# Patient Record
Sex: Male | Born: 1950
Health system: Southern US, Community
[De-identification: ages and names within clinical notes are randomized; demographics above are authoritative.]

## PROBLEM LIST (undated history)

## (undated) DIAGNOSIS — G4730 Sleep apnea, unspecified: Secondary | ICD-10-CM

## (undated) DIAGNOSIS — M1990 Unspecified osteoarthritis, unspecified site: Secondary | ICD-10-CM

## (undated) DIAGNOSIS — R0602 Shortness of breath: Secondary | ICD-10-CM

## (undated) DIAGNOSIS — E785 Hyperlipidemia, unspecified: Secondary | ICD-10-CM

## (undated) DIAGNOSIS — Z9889 Other specified postprocedural states: Secondary | ICD-10-CM

## (undated) DIAGNOSIS — R112 Nausea with vomiting, unspecified: Secondary | ICD-10-CM

## (undated) DIAGNOSIS — E039 Hypothyroidism, unspecified: Secondary | ICD-10-CM

## (undated) DIAGNOSIS — R809 Proteinuria, unspecified: Secondary | ICD-10-CM

## (undated) DIAGNOSIS — I10 Essential (primary) hypertension: Secondary | ICD-10-CM

## (undated) DIAGNOSIS — Z8719 Personal history of other diseases of the digestive system: Secondary | ICD-10-CM

## (undated) HISTORY — PX: TONSILLECTOMY: SUR1361

## (undated) HISTORY — PX: FASCIOTOMY: SHX132

## (undated) HISTORY — PX: APPENDECTOMY: SHX54

## (undated) HISTORY — PX: JOINT REPLACEMENT: SHX530

## (undated) HISTORY — PX: KNEE ARTHROSCOPY: SUR90

## (undated) HISTORY — PX: ARM WOUND REPAIR / CLOSURE: SUR1141

## (undated) HISTORY — PX: CARPAL TUNNEL RELEASE: SHX101

## (undated) HISTORY — PX: HERNIA REPAIR: SHX51

---

## 2000-04-09 ENCOUNTER — Ambulatory Visit (HOSPITAL_BASED_OUTPATIENT_CLINIC_OR_DEPARTMENT_OTHER): Admission: RE | Admit: 2000-04-09 | Discharge: 2000-04-09 | Payer: Self-pay | Admitting: Family Medicine

## 2000-07-22 ENCOUNTER — Encounter: Admission: RE | Admit: 2000-07-22 | Discharge: 2000-07-22 | Payer: Self-pay | Admitting: Neurosurgery

## 2000-07-22 ENCOUNTER — Encounter: Payer: Self-pay | Admitting: Neurosurgery

## 2003-06-03 ENCOUNTER — Encounter: Admission: RE | Admit: 2003-06-03 | Discharge: 2003-06-03 | Payer: Self-pay | Admitting: Orthopaedic Surgery

## 2003-06-03 ENCOUNTER — Encounter: Payer: Self-pay | Admitting: Orthopaedic Surgery

## 2008-09-25 ENCOUNTER — Encounter: Admission: RE | Admit: 2008-09-25 | Discharge: 2008-09-25 | Payer: Self-pay | Admitting: Rheumatology

## 2010-03-25 ENCOUNTER — Inpatient Hospital Stay (HOSPITAL_COMMUNITY): Admission: RE | Admit: 2010-03-25 | Discharge: 2010-03-27 | Payer: Self-pay | Admitting: Orthopedic Surgery

## 2010-06-10 ENCOUNTER — Ambulatory Visit (HOSPITAL_BASED_OUTPATIENT_CLINIC_OR_DEPARTMENT_OTHER): Admission: RE | Admit: 2010-06-10 | Discharge: 2010-06-10 | Payer: Self-pay | Admitting: Orthopedic Surgery

## 2010-12-03 LAB — POCT I-STAT, CHEM 8
BUN: 10 mg/dL (ref 6–23)
Creatinine, Ser: 0.8 mg/dL (ref 0.4–1.5)
Glucose, Bld: 90 mg/dL (ref 70–99)
Hemoglobin: 14.6 g/dL (ref 13.0–17.0)
Potassium: 4.1 mEq/L (ref 3.5–5.1)
TCO2: 25 mmol/L (ref 0–100)

## 2010-12-06 LAB — URINALYSIS, ROUTINE W REFLEX MICROSCOPIC
Bilirubin Urine: NEGATIVE
Glucose, UA: NEGATIVE mg/dL
Hgb urine dipstick: NEGATIVE
Specific Gravity, Urine: 1.007 (ref 1.005–1.030)

## 2010-12-06 LAB — CBC
HCT: 33.9 % — ABNORMAL LOW (ref 39.0–52.0)
HCT: 35.1 % — ABNORMAL LOW (ref 39.0–52.0)
Hemoglobin: 12.1 g/dL — ABNORMAL LOW (ref 13.0–17.0)
MCH: 31.5 pg (ref 26.0–34.0)
MCH: 31.7 pg (ref 26.0–34.0)
MCH: 31.8 pg (ref 26.0–34.0)
MCHC: 34.1 g/dL (ref 30.0–36.0)
MCHC: 34.4 g/dL (ref 30.0–36.0)
MCHC: 34.4 g/dL (ref 30.0–36.0)
MCV: 92.3 fL (ref 78.0–100.0)
MCV: 92.4 fL (ref 78.0–100.0)
Platelets: 200 10*3/uL (ref 150–400)
Platelets: 250 10*3/uL (ref 150–400)
RBC: 4.52 MIL/uL (ref 4.22–5.81)
RDW: 13.5 % (ref 11.5–15.5)
RDW: 13.8 % (ref 11.5–15.5)
WBC: 12.1 10*3/uL — ABNORMAL HIGH (ref 4.0–10.5)

## 2010-12-06 LAB — BASIC METABOLIC PANEL
BUN: 10 mg/dL (ref 6–23)
BUN: 7 mg/dL (ref 6–23)
CO2: 26 mEq/L (ref 19–32)
CO2: 26 mEq/L (ref 19–32)
CO2: 27 mEq/L (ref 19–32)
Calcium: 9.7 mg/dL (ref 8.4–10.5)
Chloride: 101 mEq/L (ref 96–112)
Chloride: 102 mEq/L (ref 96–112)
Creatinine, Ser: 0.78 mg/dL (ref 0.4–1.5)
Creatinine, Ser: 0.91 mg/dL (ref 0.4–1.5)
GFR calc Af Amer: >60 mL/min (ref >60)
GFR calc non Af Amer: >60 mL/min (ref >60)
Glucose, Bld: 123 mg/dL — ABNORMAL HIGH (ref 70–99)
Glucose, Bld: 141 mg/dL — ABNORMAL HIGH (ref 70–99)
Glucose, Bld: 90 mg/dL (ref 70–99)
Potassium: 3.6 mEq/L (ref 3.5–5.1)
Potassium: 4 mEq/L (ref 3.5–5.1)
Sodium: 134 mEq/L — ABNORMAL LOW (ref 135–145)

## 2010-12-06 LAB — DIFFERENTIAL
Basophils Relative: 1 % (ref 0–1)
Eosinophils Absolute: 0.3 10*3/uL (ref 0.0–0.7)
Eosinophils Relative: 4 % (ref 0–5)
Lymphs Abs: 2.3 10*3/uL (ref 0.7–4.0)
Monocytes Relative: 10 % (ref 3–12)

## 2010-12-06 LAB — TYPE AND SCREEN: Antibody Screen: NEGATIVE

## 2010-12-06 LAB — SURGICAL PCR SCREEN
MRSA, PCR: NEGATIVE
Staphylococcus aureus: POSITIVE — AB

## 2010-12-06 LAB — PROTIME-INR
INR: 1.15 (ref 0.00–1.49)
Prothrombin Time: 13.4 seconds (ref 11.6–15.2)

## 2011-08-31 IMAGING — CR DG KNEE 1-2V PORT*R*
2 series · 2 of 2 positions shown · non-contrast
Comparison: None.

CLINICAL DATA: Post knee replacement

PORTABLE RIGHT KNEE - 1-2 VIEW

[view not recorded (1 of 2)]
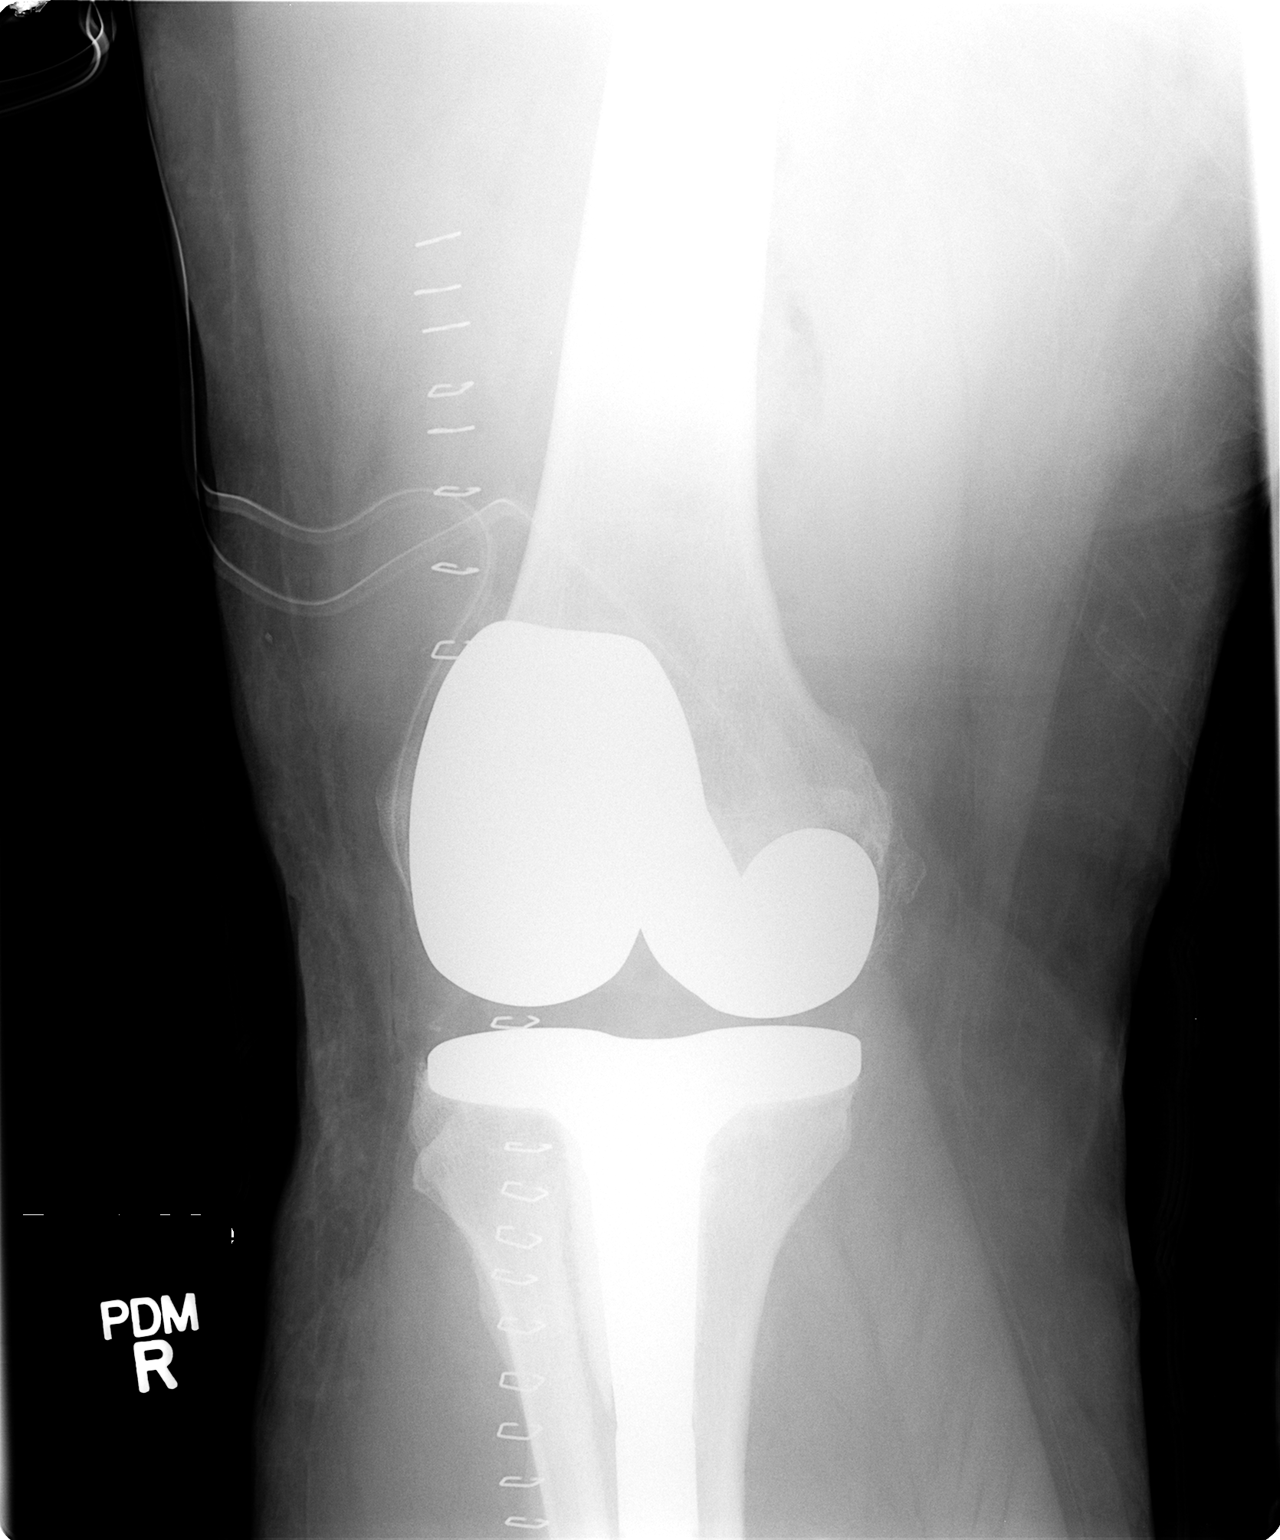

[view not recorded (2 of 2)]
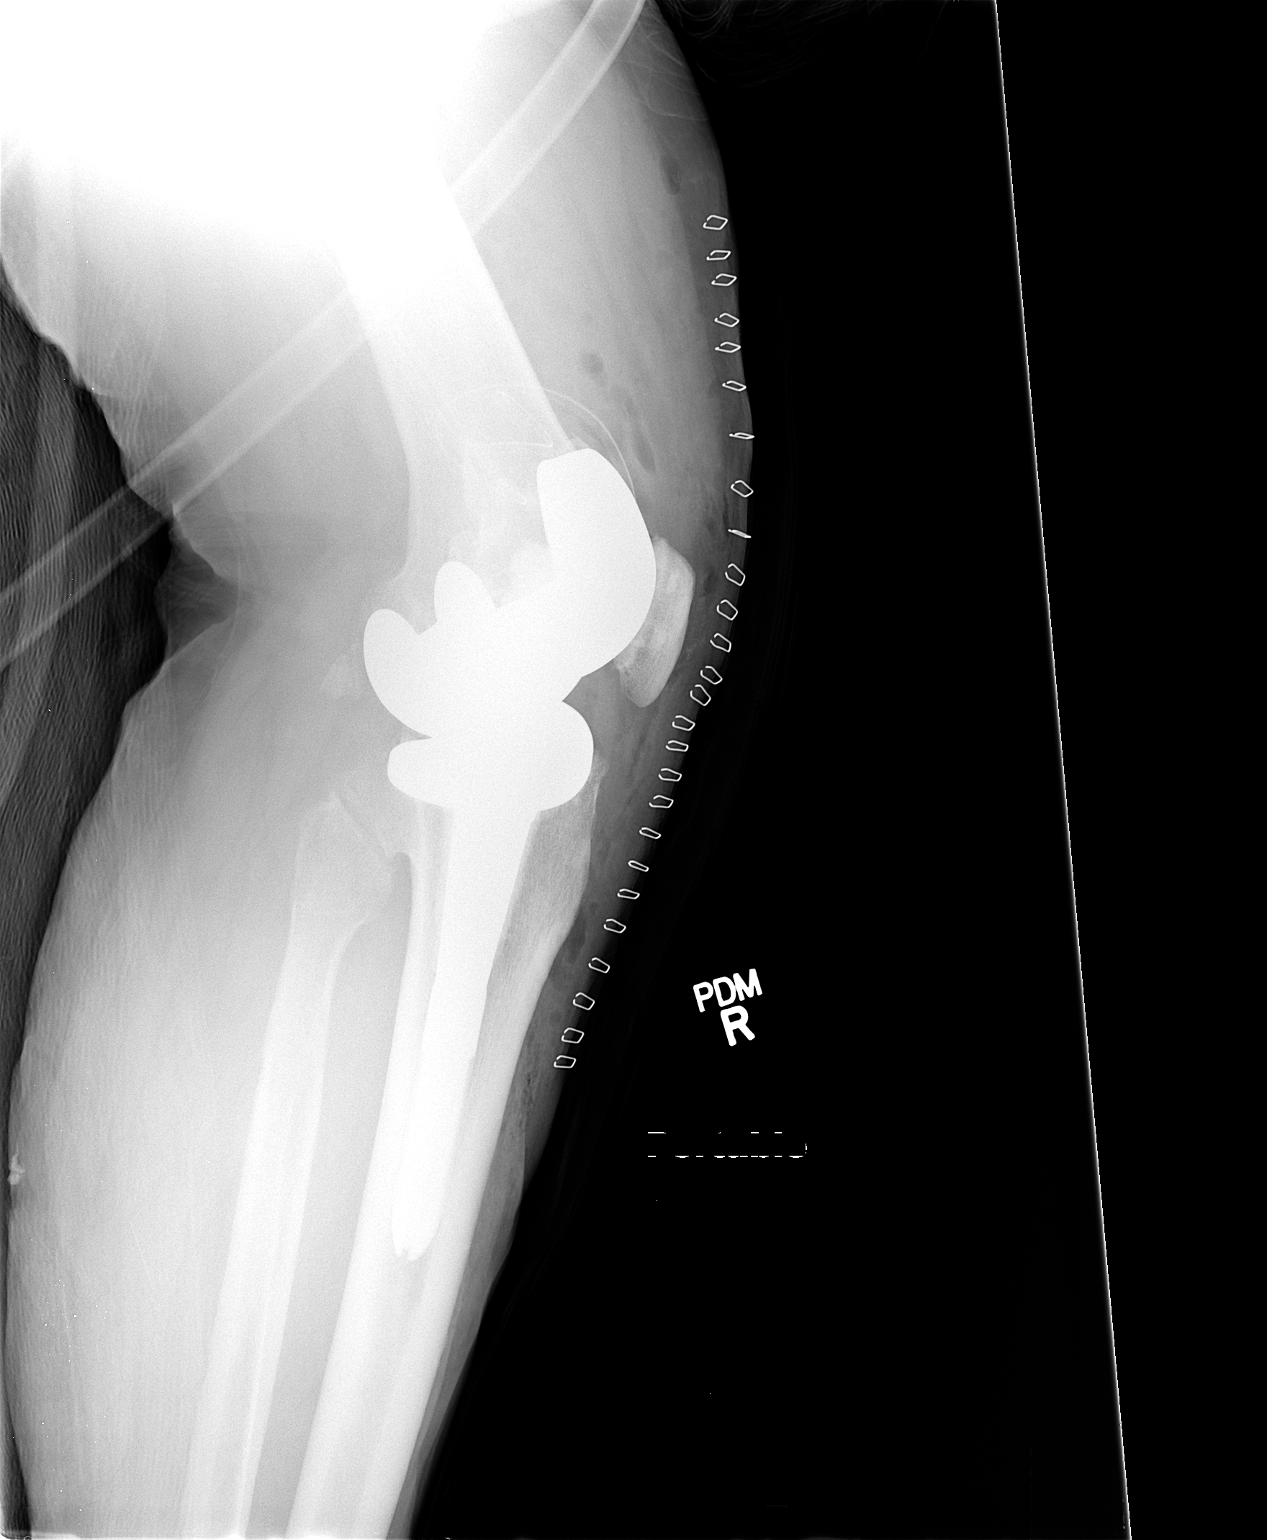

[2 of 2 positions shown; findings below may reference images not displayed]

FINDINGS: AP and cross-table lateral views obtained with portable
apparatus, shows the patient status post right total knee
replacement.  The lateral view is not a true lateral.  The position
and alignment of the prosthetic components appears to be
satisfactory. There is a surgical drain in the joint space.  There
are no acute complications evident.
IMPRESSION: Good position alignment following right total knee replacement.

## 2012-08-21 ENCOUNTER — Other Ambulatory Visit: Payer: Self-pay | Admitting: Orthopedic Surgery

## 2012-08-25 ENCOUNTER — Encounter (HOSPITAL_COMMUNITY): Payer: Self-pay | Admitting: Pharmacy Technician

## 2012-08-30 ENCOUNTER — Encounter (HOSPITAL_COMMUNITY)
Admission: RE | Admit: 2012-08-30 | Discharge: 2012-08-30 | Disposition: A | Discharge status: Home / Self Care | Payer: Managed Care, Other (non HMO) | Source: Ambulatory Visit | Attending: Orthopedic Surgery | Admitting: Orthopedic Surgery

## 2012-08-30 ENCOUNTER — Encounter (HOSPITAL_COMMUNITY): Payer: Self-pay

## 2012-08-30 HISTORY — DX: Essential (primary) hypertension: I10

## 2012-08-30 HISTORY — DX: Other specified postprocedural states: R11.2

## 2012-08-30 HISTORY — DX: Sleep apnea, unspecified: G47.30

## 2012-08-30 HISTORY — DX: Other specified postprocedural states: Z98.890

## 2012-08-30 HISTORY — DX: Personal history of other diseases of the digestive system: Z87.19

## 2012-08-30 HISTORY — DX: Hyperlipidemia, unspecified: E78.5

## 2012-08-30 HISTORY — DX: Shortness of breath: R06.02

## 2012-08-30 HISTORY — DX: Hypothyroidism, unspecified: E03.9

## 2012-08-30 HISTORY — DX: Proteinuria, unspecified: R80.9

## 2012-08-30 HISTORY — DX: Unspecified osteoarthritis, unspecified site: M19.90

## 2012-08-30 LAB — CBC WITH DIFFERENTIAL/PLATELET
Basophils Absolute: 0.1 10*3/uL (ref 0.0–0.1)
Eosinophils Relative: 4 % (ref 0–5)
HCT: 40.2 % (ref 39.0–52.0)
Hemoglobin: 13.2 g/dL (ref 13.0–17.0)
Lymphocytes Relative: 29 % (ref 12–46)
MCV: 92.2 fL (ref 78.0–100.0)
Monocytes Absolute: 0.8 10*3/uL (ref 0.1–1.0)
Monocytes Relative: 11 % (ref 3–12)
Neutro Abs: 4.3 10*3/uL (ref 1.7–7.7)
RDW: 13.3 % (ref 11.5–15.5)
WBC: 7.7 10*3/uL (ref 4.0–10.5)

## 2012-08-30 LAB — BASIC METABOLIC PANEL
BUN: 18 mg/dL (ref 6–23)
CO2: 26 mEq/L (ref 19–32)
Chloride: 103 mEq/L (ref 96–112)
Creatinine, Ser: 0.75 mg/dL (ref 0.50–1.35)
GFR calc Af Amer: >90 mL/min (ref >90)
Potassium: 3.9 mEq/L (ref 3.5–5.1)

## 2012-08-30 LAB — APTT: aPTT: 31 seconds (ref 24–37)

## 2012-08-30 LAB — TYPE AND SCREEN
ABO/RH(D): A POS
Antibody Screen: NEGATIVE

## 2012-08-30 NOTE — H&P (Signed)
TOTAL KNEE ADMISSION H&P  Patient is being admitted for left total knee arthroplasty.  Subjective:  Chief Complaint:left knee pain.  HPI: Ryan Branch, 61 y.o. male, has a history of pain and functional disability in the left knee due to arthritis and has failed non-surgical conservative treatments for greater than 12 weeks to includeNSAID's and/or analgesics, corticosteriod injections, viscosupplementation injections and activity modification.  Onset of symptoms was gradual, starting 5 years ago with gradually worsening course since that time .  Patient currently rates pain in the left knee(s) at 7 out of 10 with activity. Patient has worsening of pain with activity and weight bearing and pain that interferes with activities of daily living.  Patient has evidence of subchondral cysts, periarticular osteophytes and joint space narrowing by imaging studies . There is no active infection.  There are no active problems to display for this patient.  No past medical history on file.  No past surgical history on file.  No prescriptions prior to admission   Allergies  Allergen Reactions  . Latex Itching and Rash    History  Substance Use Topics  . Smoking status: Not on file  . Smokeless tobacco: Not on file  . Alcohol Use: Not on file    No family history on file.   Review of Systems  Constitutional: Negative.   HENT: Negative.   Eyes: Negative.   Respiratory: Negative.   Cardiovascular: Negative.   Gastrointestinal: Negative.   Genitourinary: Negative.   Musculoskeletal: Positive for myalgias and joint pain.  Skin: Negative.   Neurological: Negative.   Endo/Heme/Allergies: Negative.   Psychiatric/Behavioral: Negative.     Objective:  Physical Exam  Constitutional: He is oriented to person, place, and time. He appears well-developed and well-nourished.  HENT:  Head: Normocephalic.  Eyes: Pupils are equal, round, and reactive to light.  Cardiovascular: Normal heart  sounds.   Respiratory: Breath sounds normal.  GI: Bowel sounds are normal.  Musculoskeletal:       Left knee: He exhibits decreased range of motion, effusion and deformity. tenderness found. Medial joint line tenderness noted.  Neurological: He is alert and oriented to person, place, and time.  Psychiatric: He has a normal mood and affect.    Vital signs in last 24 hours:    Labs:   There is no height or weight on file to calculate BMI.   Imaging Review Plain radiographs demonstrate severe degenerative joint disease of the left knee(s). The overall alignment issignificant varus. The bone quality appears to be excellent for age and reported activity level.  Assessment/Plan:  End stage arthritis, left knee   The patient history, physical examination, clinical judgment of the provider and imaging studies are consistent with end stage degenerative joint disease of the left knee(s) and total knee arthroplasty is deemed medically necessary. The treatment options including medical management, injection therapy arthroscopy and arthroplasty were discussed at length. The risks and benefits of total knee arthroplasty were presented and reviewed. The risks due to aseptic loosening, infection, stiffness, patella tracking problems, thromboembolic complications and other imponderables were discussed. The patient acknowledged the explanation, agreed to proceed with the plan and consent was signed. Patient is being admitted for inpatient treatment for surgery, pain control, PT, OT, prophylactic antibiotics, VTE prophylaxis, progressive ambulation and ADL's and discharge planning. The patient is planning to be discharged home with home health services

## 2012-08-30 NOTE — Pre-Procedure Instructions (Signed)
Ryan Branch  08/30/2012   Your procedure is scheduled on:  Monday, December 16th.  Report to [facility] at 5:30 AM.  Call this number if you have problems the morning of surgery: [phone]   Remember: Nothing to eat or drink after Midnight.    Take these medicines the morning of surgery with A SIP OF WATER: Allopurinol (Zyloprim), Levothyroxine (Synthyroid).   Do not wear jewelry, make-up or nail polish.  Do not wear lotions, powders, or perfumes. You may wear deodorant.  Do not shave 48 hours prior to surgery. Men may shave face and neck.  Do not bring valuables to the hospital.  Contacts, dentures or bridgework may not be worn into surgery.  Leave suitcase in the car. After surgery it may be brought to your room.  For patients admitted to the hospital, checkout time is 11:00 AM the day of discharge.   Patients discharged the day of surgery will not be allowed to drive home.  Name and phone number of your driver: NA     Special Instructions: Shower using CHG 2 nights before surgery and the night before surgery.  If you shower the day of surgery use CHG.  Use special wash - you have one bottle of CHG for all showers.  You should use approximately 1/3 of the bottle for each shower.   Please read over the following fact sheets that you were given: Pain Booklet, Coughing and Deep Breathing, Blood Transfusion Information and Surgical Site Infection Prevention

## 2012-09-03 MED ORDER — CHLORHEXIDINE GLUCONATE 4 % EX LIQD: 60.0000 mL | Freq: Once | CUTANEOUS | Status: DC

## 2012-09-03 MED ORDER — CEFAZOLIN SODIUM 10 G IJ SOLR
3.0000 g | INTRAMUSCULAR | Status: AC
  Administered 2012-09-04: 3 g via INTRAVENOUS
  Filled 2012-09-03 (×2): qty 3000

## 2012-09-03 MED ORDER — DEXTROSE-NACL 5-0.45 % IV SOLN: INTRAVENOUS | Status: DC

## 2012-09-04 ENCOUNTER — Inpatient Hospital Stay (HOSPITAL_COMMUNITY)
Admission: RE | Admit: 2012-09-04 | Discharge: 2012-09-05 | DRG: 470 | Disposition: A | Discharge status: Home Health | Payer: Managed Care, Other (non HMO) | Source: Ambulatory Visit | Attending: Orthopedic Surgery | Admitting: Orthopedic Surgery

## 2012-09-04 ENCOUNTER — Encounter (HOSPITAL_COMMUNITY): Payer: Self-pay | Admitting: Vascular Surgery

## 2012-09-04 ENCOUNTER — Inpatient Hospital Stay (HOSPITAL_COMMUNITY): Payer: Managed Care, Other (non HMO)

## 2012-09-04 ENCOUNTER — Ambulatory Visit (HOSPITAL_COMMUNITY): Payer: Managed Care, Other (non HMO) | Admitting: Anesthesiology

## 2012-09-04 ENCOUNTER — Encounter (HOSPITAL_COMMUNITY): Payer: Self-pay | Admitting: Anesthesiology

## 2012-09-04 ENCOUNTER — Encounter (HOSPITAL_COMMUNITY)
Admission: RE | Disposition: A | Discharge status: Home Health | Payer: Self-pay | Source: Ambulatory Visit | Attending: Orthopedic Surgery

## 2012-09-04 DIAGNOSIS — Z01810 Encounter for preprocedural cardiovascular examination: Secondary | ICD-10-CM

## 2012-09-04 DIAGNOSIS — E785 Hyperlipidemia, unspecified: Secondary | ICD-10-CM | POA: Diagnosis present

## 2012-09-04 DIAGNOSIS — I10 Essential (primary) hypertension: Secondary | ICD-10-CM | POA: Diagnosis present

## 2012-09-04 DIAGNOSIS — G4733 Obstructive sleep apnea (adult) (pediatric): Secondary | ICD-10-CM | POA: Diagnosis present

## 2012-09-04 DIAGNOSIS — M1712 Unilateral primary osteoarthritis, left knee: Secondary | ICD-10-CM | POA: Diagnosis present

## 2012-09-04 DIAGNOSIS — M1710 Unilateral primary osteoarthritis, unspecified knee: Principal | ICD-10-CM | POA: Diagnosis present

## 2012-09-04 DIAGNOSIS — Z01812 Encounter for preprocedural laboratory examination: Secondary | ICD-10-CM

## 2012-09-04 DIAGNOSIS — E039 Hypothyroidism, unspecified: Secondary | ICD-10-CM | POA: Diagnosis present

## 2012-09-04 DIAGNOSIS — Z6841 Body Mass Index (BMI) 40.0 and over, adult: Secondary | ICD-10-CM

## 2012-09-04 DIAGNOSIS — Z01818 Encounter for other preprocedural examination: Secondary | ICD-10-CM

## 2012-09-04 HISTORY — PX: TOTAL KNEE ARTHROPLASTY: SHX125

## 2012-09-04 LAB — URINALYSIS, MICROSCOPIC ONLY
Leukocytes, UA: NEGATIVE
Nitrite: NEGATIVE
Protein, ur: 100 mg/dL — AB
Specific Gravity, Urine: 1.024 (ref 1.005–1.030)
Urobilinogen, UA: 1 mg/dL (ref 0.0–1.0)

## 2012-09-04 SURGERY — ARTHROPLASTY, KNEE, TOTAL
Anesthesia: General | Site: Knee | Laterality: Left | Wound class: Clean

## 2012-09-04 MED ORDER — FENTANYL CITRATE 0.05 MG/ML IJ SOLN
INTRAMUSCULAR | Status: DC | PRN
  Administered 2012-09-04: 100 ug via INTRAVENOUS
  Administered 2012-09-04 (×5): 50 ug via INTRAVENOUS

## 2012-09-04 MED ORDER — LACTATED RINGERS IV SOLN
INTRAVENOUS | Status: DC | PRN
  Administered 2012-09-04 (×2): via INTRAVENOUS

## 2012-09-04 MED ORDER — SIMVASTATIN 20 MG PO TABS
20.0000 mg | ORAL_TABLET | Freq: Every day | ORAL | Status: DC
  Administered 2012-09-04: 20 mg via ORAL
  Filled 2012-09-04 (×2): qty 1

## 2012-09-04 MED ORDER — ONDANSETRON HCL 4 MG/2ML IJ SOLN
INTRAMUSCULAR | Status: DC | PRN
  Administered 2012-09-04 (×2): 4 mg via INTRAVENOUS

## 2012-09-04 MED ORDER — METHOCARBAMOL 100 MG/ML IJ SOLN
500.0000 mg | Freq: Four times a day (QID) | INTRAVENOUS | Status: DC | PRN
  Administered 2012-09-04: 500 mg via INTRAVENOUS
  Filled 2012-09-04: qty 5

## 2012-09-04 MED ORDER — HYDROMORPHONE HCL PF 1 MG/ML IJ SOLN
0.2500 mg | INTRAMUSCULAR | Status: DC | PRN
  Administered 2012-09-04 (×2): 0.5 mg via INTRAVENOUS

## 2012-09-04 MED ORDER — KCL IN DEXTROSE-NACL 20-5-0.45 MEQ/L-%-% IV SOLN
INTRAVENOUS | Status: DC
  Administered 2012-09-04 (×2): via INTRAVENOUS
  Filled 2012-09-04 (×6): qty 1000

## 2012-09-04 MED ORDER — LISINOPRIL 20 MG PO TABS
20.0000 mg | ORAL_TABLET | Freq: Every day | ORAL | Status: DC
  Filled 2012-09-04 (×3): qty 1

## 2012-09-04 MED ORDER — ONDANSETRON HCL 4 MG/2ML IJ SOLN: 4.0000 mg | Freq: Four times a day (QID) | INTRAMUSCULAR | Status: DC | PRN

## 2012-09-04 MED ORDER — CELECOXIB 200 MG PO CAPS
200.0000 mg | ORAL_CAPSULE | Freq: Two times a day (BID) | ORAL | Status: DC
  Administered 2012-09-04 – 2012-09-05 (×3): 200 mg via ORAL
  Filled 2012-09-04 (×5): qty 1

## 2012-09-04 MED ORDER — SUCCINYLCHOLINE CHLORIDE 20 MG/ML IJ SOLN
INTRAMUSCULAR | Status: DC | PRN
  Administered 2012-09-04: 180 mg via INTRAVENOUS

## 2012-09-04 MED ORDER — ONDANSETRON HCL 4 MG/2ML IJ SOLN: 4.0000 mg | Freq: Once | INTRAMUSCULAR | Status: DC | PRN

## 2012-09-04 MED ORDER — CEFUROXIME SODIUM 1.5 G IJ SOLR
INTRAMUSCULAR | Status: DC | PRN
  Administered 2012-09-04: 1.5 g

## 2012-09-04 MED ORDER — HYDROMORPHONE HCL PF 1 MG/ML IJ SOLN: 1.0000 mg | INTRAMUSCULAR | Status: DC | PRN

## 2012-09-04 MED ORDER — PROPOFOL 10 MG/ML IV BOLUS
INTRAVENOUS | Status: DC | PRN
  Administered 2012-09-04: 50 mg via INTRAVENOUS
  Administered 2012-09-04: 400 mg via INTRAVENOUS

## 2012-09-04 MED ORDER — LIDOCAINE HCL (CARDIAC) 20 MG/ML IV SOLN
INTRAVENOUS | Status: DC | PRN
  Administered 2012-09-04: 100 mg via INTRAVENOUS

## 2012-09-04 MED ORDER — ONDANSETRON HCL 4 MG PO TABS: 4.0000 mg | ORAL_TABLET | Freq: Four times a day (QID) | ORAL | Status: DC | PRN

## 2012-09-04 MED ORDER — SCOPOLAMINE 1 MG/3DAYS TD PT72
MEDICATED_PATCH | TRANSDERMAL | Status: AC
  Filled 2012-09-04: qty 1

## 2012-09-04 MED ORDER — SODIUM CHLORIDE 0.9 % IR SOLN
Status: DC | PRN
  Administered 2012-09-04: 3000 mL

## 2012-09-04 MED ORDER — ACETAMINOPHEN 10 MG/ML IV SOLN
INTRAVENOUS | Status: DC | PRN
  Administered 2012-09-04: 1000 mg via INTRAVENOUS

## 2012-09-04 MED ORDER — FENOFIBRATE 160 MG PO TABS
160.0000 mg | ORAL_TABLET | Freq: Every day | ORAL | Status: DC
  Administered 2012-09-04 – 2012-09-05 (×2): 160 mg via ORAL
  Filled 2012-09-04 (×3): qty 1

## 2012-09-04 MED ORDER — ALUM & MAG HYDROXIDE-SIMETH 200-200-20 MG/5ML PO SUSP: 30.0000 mL | ORAL | Status: DC | PRN

## 2012-09-04 MED ORDER — ACETAMINOPHEN 10 MG/ML IV SOLN
1000.0000 mg | Freq: Four times a day (QID) | INTRAVENOUS | Status: AC
  Administered 2012-09-04 – 2012-09-05 (×4): 1000 mg via INTRAVENOUS
  Filled 2012-09-04 (×4): qty 100

## 2012-09-04 MED ORDER — ACETAMINOPHEN 325 MG PO TABS: 650.0000 mg | ORAL_TABLET | Freq: Four times a day (QID) | ORAL | Status: DC | PRN

## 2012-09-04 MED ORDER — BISACODYL 5 MG PO TBEC: 5.0000 mg | DELAYED_RELEASE_TABLET | Freq: Every day | ORAL | Status: DC | PRN

## 2012-09-04 MED ORDER — ALLOPURINOL 300 MG PO TABS
300.0000 mg | ORAL_TABLET | Freq: Every day | ORAL | Status: DC
  Administered 2012-09-04 – 2012-09-05 (×2): 300 mg via ORAL
  Filled 2012-09-04 (×3): qty 1

## 2012-09-04 MED ORDER — DIPHENHYDRAMINE HCL 12.5 MG/5ML PO ELIX: 12.5000 mg | ORAL_SOLUTION | ORAL | Status: DC | PRN

## 2012-09-04 MED ORDER — OXYCODONE HCL 5 MG PO TABS
5.0000 mg | ORAL_TABLET | ORAL | Status: DC | PRN
  Administered 2012-09-04: 10 mg via ORAL
  Filled 2012-09-04: qty 2

## 2012-09-04 MED ORDER — HYDROMORPHONE HCL PF 1 MG/ML IJ SOLN
INTRAMUSCULAR | Status: AC
  Filled 2012-09-04: qty 2

## 2012-09-04 MED ORDER — METOCLOPRAMIDE HCL 10 MG PO TABS: 5.0000 mg | ORAL_TABLET | Freq: Three times a day (TID) | ORAL | Status: DC | PRN

## 2012-09-04 MED ORDER — SCOPOLAMINE 1 MG/3DAYS TD PT72
MEDICATED_PATCH | TRANSDERMAL | Status: DC | PRN
  Administered 2012-09-04: 1 via TRANSDERMAL

## 2012-09-04 MED ORDER — CEFUROXIME SODIUM 1.5 G IJ SOLR
INTRAMUSCULAR | Status: AC
  Filled 2012-09-04: qty 1.5

## 2012-09-04 MED ORDER — METHOCARBAMOL 500 MG PO TABS
500.0000 mg | ORAL_TABLET | Freq: Four times a day (QID) | ORAL | Status: DC | PRN
  Filled 2012-09-04: qty 1

## 2012-09-04 MED ORDER — MAGNESIUM HYDROXIDE 400 MG/5ML PO SUSP: 30.0000 mL | Freq: Every day | ORAL | Status: DC | PRN

## 2012-09-04 MED ORDER — LEVOTHYROXINE SODIUM 75 MCG PO TABS
75.0000 ug | ORAL_TABLET | Freq: Every day | ORAL | Status: DC
  Administered 2012-09-04 – 2012-09-05 (×2): 75 ug via ORAL
  Filled 2012-09-04 (×3): qty 1

## 2012-09-04 MED ORDER — ASPIRIN EC 325 MG PO TBEC
325.0000 mg | DELAYED_RELEASE_TABLET | Freq: Two times a day (BID) | ORAL | Status: DC
  Administered 2012-09-04 – 2012-09-05 (×2): 325 mg via ORAL
  Filled 2012-09-04 (×5): qty 1

## 2012-09-04 MED ORDER — ACETAMINOPHEN 10 MG/ML IV SOLN
INTRAVENOUS | Status: AC
  Filled 2012-09-04: qty 100

## 2012-09-04 MED ORDER — MENTHOL 3 MG MT LOZG: 1.0000 | LOZENGE | OROMUCOSAL | Status: DC | PRN

## 2012-09-04 MED ORDER — ACETAMINOPHEN 650 MG RE SUPP: 650.0000 mg | Freq: Four times a day (QID) | RECTAL | Status: DC | PRN

## 2012-09-04 MED ORDER — EPHEDRINE SULFATE 50 MG/ML IJ SOLN
INTRAMUSCULAR | Status: DC | PRN
  Administered 2012-09-04 (×2): 10 mg via INTRAVENOUS

## 2012-09-04 MED ORDER — FUROSEMIDE 80 MG PO TABS
80.0000 mg | ORAL_TABLET | Freq: Every day | ORAL | Status: DC
  Administered 2012-09-04 – 2012-09-05 (×2): 80 mg via ORAL
  Filled 2012-09-04 (×3): qty 1

## 2012-09-04 MED ORDER — PHENOL 1.4 % MT LIQD: 1.0000 | OROMUCOSAL | Status: DC | PRN

## 2012-09-04 MED ORDER — FLEET ENEMA 7-19 GM/118ML RE ENEM: 1.0000 | ENEMA | Freq: Once | RECTAL | Status: AC | PRN

## 2012-09-04 MED ORDER — METOCLOPRAMIDE HCL 5 MG/ML IJ SOLN: 5.0000 mg | Freq: Three times a day (TID) | INTRAMUSCULAR | Status: DC | PRN

## 2012-09-04 MED ORDER — POTASSIUM CHLORIDE ER 10 MEQ PO TBCR
10.0000 meq | EXTENDED_RELEASE_TABLET | Freq: Every day | ORAL | Status: DC
  Administered 2012-09-04 – 2012-09-05 (×2): 10 meq via ORAL
  Filled 2012-09-04 (×2): qty 1

## 2012-09-04 MED ORDER — MIDAZOLAM HCL 5 MG/5ML IJ SOLN
INTRAMUSCULAR | Status: DC | PRN
  Administered 2012-09-04: 2 mg via INTRAVENOUS

## 2012-09-04 MED ORDER — DROPERIDOL 2.5 MG/ML IJ SOLN
5.0000 mg | INTRAMUSCULAR | Status: AC
  Administered 2012-09-04: 0.625 mg via INTRAVENOUS
  Filled 2012-09-04: qty 2

## 2012-09-04 SURGICAL SUPPLY — 55 items
BANDAGE ELASTIC 6 VELCRO ST LF (GAUZE/BANDAGES/DRESSINGS) ×2 IMPLANT
BANDAGE ESMARK 6X9 LF (GAUZE/BANDAGES/DRESSINGS) ×1 IMPLANT
BLADE SAG 18X100X1.27 (BLADE) ×2 IMPLANT
BLADE SAW SGTL 13X75X1.27 (BLADE) ×2 IMPLANT
BLADE SURG ROTATE 9660 (MISCELLANEOUS) IMPLANT
BNDG ELASTIC 6X10 VLCR STRL LF (GAUZE/BANDAGES/DRESSINGS) ×2 IMPLANT
BNDG ESMARK 6X9 LF (GAUZE/BANDAGES/DRESSINGS) ×2
BOWL SMART MIX CTS (DISPOSABLE) ×2 IMPLANT
CEMENT HV SMART SET (Cement) ×4 IMPLANT
CLOTH BEACON ORANGE TIMEOUT ST (SAFETY) ×2 IMPLANT
COVER BACK TABLE 24X17X13 BIG (DRAPES) IMPLANT
COVER SURGICAL LIGHT HANDLE (MISCELLANEOUS) ×2 IMPLANT
CUFF TOURNIQUET SINGLE 34IN LL (TOURNIQUET CUFF) IMPLANT
CUFF TOURNIQUET SINGLE 44IN (TOURNIQUET CUFF) IMPLANT
DRAPE EXTREMITY T 121X128X90 (DRAPE) ×2 IMPLANT
DRAPE U-SHAPE 47X51 STRL (DRAPES) ×2 IMPLANT
DRSG PAD ABDOMINAL 8X10 ST (GAUZE/BANDAGES/DRESSINGS) ×2 IMPLANT
DURAPREP 26ML APPLICATOR (WOUND CARE) ×2 IMPLANT
ELECT REM PT RETURN 9FT ADLT (ELECTROSURGICAL) ×2
ELECTRODE REM PT RTRN 9FT ADLT (ELECTROSURGICAL) ×1 IMPLANT
EVACUATOR 1/8 PVC DRAIN (DRAIN) ×2 IMPLANT
GAUZE XEROFORM 1X8 LF (GAUZE/BANDAGES/DRESSINGS) ×2 IMPLANT
GLOVE BIO SURGEON STRL SZ7 (GLOVE) ×2 IMPLANT
GLOVE BIO SURGEON STRL SZ7.5 (GLOVE) ×2 IMPLANT
GLOVE BIOGEL PI IND STRL 7.0 (GLOVE) ×1 IMPLANT
GLOVE BIOGEL PI IND STRL 8 (GLOVE) ×1 IMPLANT
GLOVE BIOGEL PI INDICATOR 7.0 (GLOVE) ×1
GLOVE BIOGEL PI INDICATOR 8 (GLOVE) ×1
GOWN PREVENTION PLUS XLARGE (GOWN DISPOSABLE) ×2 IMPLANT
GOWN STRL NON-REIN LRG LVL3 (GOWN DISPOSABLE) ×4 IMPLANT
HANDPIECE INTERPULSE COAX TIP (DISPOSABLE) ×1
HOOD PEEL AWAY FACE SHEILD DIS (HOOD) ×6 IMPLANT
KIT BASIN OR (CUSTOM PROCEDURE TRAY) ×2 IMPLANT
KIT ROOM TURNOVER OR (KITS) ×2 IMPLANT
MANIFOLD NEPTUNE II (INSTRUMENTS) ×2 IMPLANT
NS IRRIG 1000ML POUR BTL (IV SOLUTION) ×2 IMPLANT
PACK TOTAL JOINT (CUSTOM PROCEDURE TRAY) ×2 IMPLANT
PAD ARMBOARD 7.5X6 YLW CONV (MISCELLANEOUS) ×4 IMPLANT
PADDING CAST COTTON 6X4 STRL (CAST SUPPLIES) ×2 IMPLANT
PATELLA DOME PFC 38MM (Knees) ×2 IMPLANT
SET HNDPC FAN SPRY TIP SCT (DISPOSABLE) ×1 IMPLANT
SPONGE GAUZE 4X4 12PLY (GAUZE/BANDAGES/DRESSINGS) ×2 IMPLANT
STAPLER VISISTAT 35W (STAPLE) ×2 IMPLANT
SUCTION FRAZIER TIP 10 FR DISP (SUCTIONS) ×2 IMPLANT
SUT VIC AB 0 CTX 36 (SUTURE) ×1
SUT VIC AB 0 CTX36XBRD ANTBCTR (SUTURE) ×1 IMPLANT
SUT VIC AB 1 CTX 36 (SUTURE) ×1
SUT VIC AB 1 CTX36XBRD ANBCTR (SUTURE) ×1 IMPLANT
SUT VIC AB 2-0 CT1 27 (SUTURE) ×1
SUT VIC AB 2-0 CT1 TAPERPNT 27 (SUTURE) ×1 IMPLANT
TOWEL OR 17X24 6PK STRL BLUE (TOWEL DISPOSABLE) ×2 IMPLANT
TOWEL OR 17X26 10 PK STRL BLUE (TOWEL DISPOSABLE) ×2 IMPLANT
TRAY FOLEY CATH 14FR (SET/KITS/TRAYS/PACK) IMPLANT
TRAY REVISION SZ 4 (Knees) ×2 IMPLANT
WATER STERILE IRR 1000ML POUR (IV SOLUTION) ×6 IMPLANT

## 2012-09-04 NOTE — Progress Notes (Signed)
Physical Therapy Evaluation Patient Details Name: Ryan Branch MRN: [id number] DOB: [birth year] Today's Date: 09/04/2012 Time: 4403-4742 PT Time Calculation (min): 25 min  PT Assessment / Plan / Recommendation Clinical Impression  Pt is 61 yo male s/p left TKA who presents with minimal quad activation post-op day 0 so ambulation was limited. However, expect he will progress nicely tomorrow. Recommend acute PT to increase ROM and strength of the LLE as well as increase safe mobility and independence. Recommend HHPT at d/c.    PT Assessment  Patient needs continued PT services    Follow Up Recommendations  Home health PT;Supervision - Intermittent    Does the patient have the potential to tolerate intense rehabilitation      Barriers to Discharge None      Equipment Recommendations  None recommended by PT    Recommendations for Other Services OT consult   Frequency 7X/week    Precautions / Restrictions Precautions Precautions: Knee Precaution Comments: reviewed proper positioning of the knee Restrictions Weight Bearing Restrictions: Yes LLE Weight Bearing: Weight bearing as tolerated   Pertinent Vitals/Pain 2/10 left knee pain, premedicated      Mobility  Bed Mobility Bed Mobility: Supine to Sit;Sitting - Scoot to Edge of Bed Supine to Sit: 4: Min assist;With rails Sitting - Scoot to Edge of Bed: 5: Supervision;With rail Details for Bed Mobility Assistance: min A to LLE to get to EOB Transfers Transfers: Sit to Stand;Stand to Sit Sit to Stand: 1: +2 Total assist;From bed;With upper extremity assist Sit to Stand: Patient Percentage: 90% Stand to Sit: To chair/3-in-1;With upper extremity assist;4: Min assist Details for Transfer Assistance: vc's for hand placement, pt uses momentum to get up, likely was doing this PTA. Decreased left knee control in standing so ambulation minimized.  Ambulation/Gait Ambulation/Gait Assistance: 1: +2 Total assist Ambulation/Gait:  Patient Percentage: 90% Ambulation Distance (Feet): 3 Feet Assistive device: Rolling walker Ambulation/Gait Assistance Details: vc's for taking wt through UE's since left knee sith decreased stability, pt able to do this safely.  Gait Pattern: Step-to pattern;Decreased stance time - left Stairs: No Wheelchair Mobility Wheelchair Mobility: No    Shoulder Instructions     Exercises Total Joint Exercises Ankle Circles/Pumps: AROM;Both;20 reps;Seated Quad Sets: AROM;Both;10 reps;Seated   PT Diagnosis: Abnormality of gait;Difficulty walking;Acute pain  PT Problem List: Decreased strength;Decreased range of motion;Decreased balance;Decreased mobility;Obesity;Pain PT Treatment Interventions: DME instruction;Gait training;Functional mobility training;Therapeutic activities;Therapeutic exercise;Balance training;Patient/family education;Neuromuscular re-education   PT Goals Acute Rehab PT Goals PT Goal Formulation: With patient Time For Goal Achievement: 09/11/12 Potential to Achieve Goals: Good Pt will go Supine/Side to Sit: with modified independence PT Goal: Supine/Side to Sit - Progress: Goal set today Pt will go Sit to Supine/Side: with modified independence PT Goal: Sit to Supine/Side - Progress: Goal set today Pt will go Sit to Stand: with modified independence PT Goal: Sit to Stand - Progress: Goal set today Pt will go Stand to Sit: with modified independence PT Goal: Stand to Sit - Progress: Goal set today Pt will Ambulate: >150 feet;with modified independence;with rolling walker PT Goal: Ambulate - Progress: Goal set today Pt will Perform Home Exercise Program: Independently PT Goal: Perform Home Exercise Program - Progress: Goal set today  Visit Information  Last PT Received On: 09/04/12 Assistance Needed: +1    Subjective Data  Subjective: I'd like to go home tomorrow Patient Stated Goal: return to home and work   Prior Functioning  Home Living Lives With:  Spouse Available Help at Discharge:  Available 24 hours/day;Family Type of Home: House Home Access: Ramped entrance Home Layout: One level Bathroom Toilet: Handicapped height Bathroom Accessibility: Yes How Accessible: Accessible via walker Home Adaptive Equipment: Bedside commode/3-in-1;Walker - rolling;Shower chair with back Additional Comments: pt had other knee replaced 2 yrs ago Prior Function Level of Independence: Independent Able to Take Stairs?: No Driving: Yes 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 Communication Communication: No difficulties    Cognition  Overall Cognitive Status: Appears within functional limits for tasks assessed/performed Arousal/Alertness: Awake/alert Orientation Level: Oriented X4 / Intact Behavior During Session: [facility] for tasks performed    Extremity/Trunk Assessment Right Upper Extremity Assessment RUE ROM/Strength/Tone: [facility] for tasks assessed Left Upper Extremity Assessment LUE ROM/Strength/Tone: WFL for tasks assessed Right Lower Extremity Assessment RLE ROM/Strength/Tone: WFL for tasks assessed RLE Sensation: WFL - Light Touch;WFL - Proprioception RLE Coordination: WFL - gross motor Left Lower Extremity Assessment LLE ROM/Strength/Tone: Deficits LLE ROM/Strength/Tone Deficits: quad 1/5, hip flex 2-/5 LLE Sensation: WFL - Light Touch Trunk Assessment Trunk Assessment: Normal   Balance Balance Balance Assessed: No  End of Session PT - End of Session Equipment Utilized During Treatment: Gait belt Activity Tolerance: Other (comment) (limited by weakness left knee) Patient left: in chair;with call bell/phone within reach;with family/visitor present Nurse Communication: Mobility status CPM Left Knee CPM Left Knee: Off Left Knee Flexion (Degrees): 60  Left Knee Extension (Degrees): 0  Additional Comments: on in PACU at 1030  GP   [facility], PT  Acute Rehab Services  [phone]   Lyanne Co 09/04/2012,  4:04 PM

## 2012-09-04 NOTE — Anesthesia Preprocedure Evaluation (Addendum)
Anesthesia Evaluation  Patient identified by MRN, date of birth, ID band Patient awake    Reviewed: Allergy & Precautions, H&P , NPO status , Patient's Chart, lab work & pertinent test results  History of Anesthesia Complications (+) PONV  Airway Mallampati: II TM Distance: >3 FB Neck ROM: full    Dental  (+) Teeth Intact   Pulmonary sleep apnea ,          Cardiovascular hypertension, Rhythm:regular Rate:Normal     Neuro/Psych    GI/Hepatic hiatal hernia,   Endo/Other  Hypothyroidism   Renal/GU      Musculoskeletal   Abdominal   Peds  Hematology   Anesthesia Other Findings   Reproductive/Obstetrics                          Anesthesia Physical Anesthesia Plan  ASA: III  Anesthesia Plan: General   Post-op Pain Management: MAC Combined w/ Regional for Post-op pain   Induction: Intravenous  Airway Management Planned: Oral ETT  Additional Equipment:   Intra-op Plan:   Post-operative Plan: Extubation in OR  Informed Consent: I have reviewed the patients History and Physical, chart, labs and discussed the procedure including the risks, benefits and alternatives for the proposed anesthesia with the patient or authorized representative who has indicated his/her understanding and acceptance.     Plan Discussed with: CRNA, Anesthesiologist and Surgeon  Anesthesia Plan Comments:         Anesthesia Quick Evaluation

## 2012-09-04 NOTE — Progress Notes (Signed)
Orthopedic Tech Progress Note Patient Details:  Ryan Branch 1950/10/31 [id number] CPM applied to Left knee with appropriate settings. OHF applied to bed.  CPM Left Knee CPM Left Knee: On Left Knee Flexion (Degrees): 60  Left Knee Extension (Degrees): 0    Asia R Thompson 09/04/2012, 10:41 AM

## 2012-09-04 NOTE — Preoperative (Signed)
Beta Blockers   Reason not to administer Beta Blockers:Not Applicable 

## 2012-09-04 NOTE — Anesthesia Procedure Notes (Addendum)
Anesthesia Regional Block:  Femoral nerve block  Pre-Anesthetic Checklist: ,, timeout performed, Correct Patient, Correct Site, Correct Laterality, Correct Procedure, Correct Position, site marked, Risks and benefits discussed,  Surgical consent,  Pre-op evaluation,  At surgeon's request and post-op pain management  Laterality: Left  Prep: Maximum Sterile Barrier Precautions used, chloraprep and alcohol swabs       Needles:  Injection technique: Single-shot  Needle Type: Stimulator Needle - 80        Needle insertion depth: 8 cm   Additional Needles:  Procedures: nerve stimulator Femoral nerve block  Nerve Stimulator or Paresthesia:  Response: 0.5 mA, 0.1 ms, 8 cm  Additional Responses:   Narrative:  Start time: 09/04/2012 7:05 AM End time: 09/04/2012 7:10 AM Injection made incrementally with aspirations every 5 mL.  Performed by: Personally  Anesthesiologist: Maren Beach MD  Additional Notes: Pt accepts procedure and risks. 30cc 0.5% Marcaine w/ epi w/o difficulty or discomfort. Pt tolerated well. GES   Procedure Name: Intubation Date/Time: 09/04/2012 7:39 AM Performed by: Sharlene Dory E Patient Re-evaluated:Patient Re-evaluated prior to inductionOxygen Delivery Method: Circle system utilized Preoxygenation: Pre-oxygenation with 100% oxygen Intubation Type: IV induction Ventilation: Oral airway inserted - appropriate to patient size and Mask ventilation without difficulty Laryngoscope Size: Mac and 4 Grade View: Grade III Tube type: Oral Tube size: 7.5 mm Number of attempts: 1 Airway Equipment and Method: Stylet and LTA kit utilized Placement Confirmation: ETT inserted through vocal cords under direct vision,  positive ETCO2 and breath sounds checked- equal and bilateral Secured at: 23 cm Tube secured with: Tape Dental Injury: Injury to lip

## 2012-09-04 NOTE — Transfer of Care (Signed)
Immediate Anesthesia Transfer of Care Note  Patient: Ryan Branch  Procedure(s) Performed: Procedure(s) (LRB) with comments: TOTAL KNEE ARTHROPLASTY (Left)  Patient Location: PACU  Anesthesia Type:General  Level of Consciousness: awake, alert  and oriented  Airway & Oxygen Therapy: Patient Spontanous Breathing and Patient connected to face mask oxygen  Post-op Assessment: Report given to PACU RN, Post -op Vital signs reviewed and stable and Patient moving all extremities  Post vital signs: Reviewed and stable  Complications: No apparent anesthesia complications

## 2012-09-04 NOTE — Progress Notes (Signed)
Spoke with dr Katrinka Blazing regarding BP in 172-160/70 range.   No new orders

## 2012-09-04 NOTE — Interval H&P Note (Signed)
History and Physical Interval Note:  09/04/2012 7:15 AM  Ryan Branch  has presented today for surgery, with the diagnosis of OSTEOARTHRITIS LEFT KNEE  The various methods of treatment have been discussed with the patient and family. After consideration of risks, benefits and other options for treatment, the patient has consented to  Procedure(s) (LRB) with comments: TOTAL KNEE ARTHROPLASTY (Left) as a surgical intervention .  The patient's history has been reviewed, patient examined, no change in status, stable for surgery.  I have reviewed the patient's chart and labs.  Questions were answered to the patient's satisfaction.     Nestor Lewandowsky

## 2012-09-04 NOTE — Progress Notes (Signed)
UR COMPLETED  

## 2012-09-04 NOTE — Op Note (Signed)
PATIENT ID:      Ryan Branch  MRN:     [id number] DOB/AGE:    02/03/1951 / 61 y.o.       OPERATIVE REPORT    DATE OF PROCEDURE:  09/04/2012       PREOPERATIVE DIAGNOSIS:   OSTEOARTHRITIS LEFT KNEE      There is no height or weight on file to calculate BMI.                                                        POSTOPERATIVE DIAGNOSIS:   OSTEOARTHRITIS LEFT KNEE                                                                      PROCEDURE:  Procedure(s): TOTAL KNEE ARTHROPLASTY Using Depuy Sigma RP implants #4L Femur, #$MBTTibia, 14 x 75 mm stem ,10mm sigma RP bearing, 38 Patella     SURGEON: Bernell Sigal J    ASSISTANT:   Shirl Harris PA-C   (Present and scrubbed throughout the case, critical for assistance with exposure, retraction, instrumentation, and closure.)         ANESTHESIA: GET with Femoral Nerve Block  DRAINS: foley, 2 medium hemovac in knee   TOURNIQUET TIME:   COMPLICATIONS:  None     SPECIMENS: None   INDICATIONS FOR PROCEDURE: The patient has  OSTEOARTHRITIS LEFT KNEE, varus deformities, XR shows bone on bone arthritis. Patient has failed all conservative measures including anti-inflammatory medicines, narcotics, attempts at  exercise and weight loss, cortisone injections and viscosupplementation.  Risks and benefits of surgery have been discussed, questions answered.   DESCRIPTION OF PROCEDURE: The patient identified by armband, received  right femoral nerve block and IV antibiotics, in the holding area at [facility]. Patient taken to the operating room, appropriate anesthetic  monitors were attached General endotracheal anesthesia induced with  the patient in supine position, Foley catheter was inserted. Tourniquet  applied high to the operative thigh. Lateral post and foot positioner  applied to the table, the lower extremity was then prepped and draped  in usual sterile fashion from the ankle to the tourniquet. Time-out procedure was  performed. The limb was wrapped with an Esmarch bandage and the tourniquet inflated to 350 mmHg. We began the operation by making the anterior midline incision starting at handbreadth above the patella going over the patella 1 cm medial to and  4 cm distal to the tibial tubercle. Small bleeders in the skin and the  subcutaneous tissue identified and cauterized. Transverse retinaculum was incised and reflected medially and a medial parapatellar arthrotomy was accomplished. the patella was everted and theprepatellar fat pad resected. The superficial medial collateral  ligament was then elevated from anterior to posterior along the proximal  flare of the tibia and anterior half of the menisci resected. The knee was hyperflexed exposing bone on bone arthritis. Peripheral and notch osteophytes as well as the cruciate ligaments were then resected. We continued to  work our way around posteriorly along the proximal tibia, and externally  rotated the tibia subluxing it  out from underneath the femur. A McHale  retractor was placed through the notch and a lateral Hohmann retractor  placed, and we then drilled through the proximal tibia in line with the  axis of the tibia followed by an axial reaming up to 14 mm to the appropriate depth for a 14 x 75 mm stem. A 2-degree  posterior slope cutting guide was applied. The tibial cutting guide was pinned into place  allowing resection of 5 mm of bone medially and about 10 mm of bone  laterally because of her varus deformity. Satisfied with the tibial resection, we then  entered the distal femur 2 mm anterior to the PCL origin with the  intramedullary guide rod and applied the distal femoral cutting guide  set at 11mm, with 5 degrees of valgus. This was pinned along the  epicondylar axis. At this point, the distal femoral cut was accomplished without difficulty. We then sized for a #4L femoral component and pinned the guide in 3 degrees of external rotation.The  chamfer cutting guide was pinned into place. The anterior, posterior, and chamfer cuts were accomplished without difficulty followed by  the Sigma RP box cutting guide and the box cut. We also removed posterior osteophytes from the posterior femoral condyles. At this  time, the knee was brought into full extension. We checked our  extension and flexion gaps and found them symmetric at 10mm.  The patella thickness measured at 24 mm. We set the cutting guide at 14 and removed the posterior 9.5-10 mm  of the patella sized for 38 button and drilled the lollipop. The knee  was then once again hyperflexed exposing the proximal tibia. We sized for a #4 MBT tibial base plate, applied the smokestack and the conical reamer. We then inserted the 4 MBT trial with a 14 x 75 stem, followed by the the Delta fin keel punch. We then hammered into place the Sigma RP trial femoral component, inserted a 10-mm trial bearing, trial patellar button, and took the knee through range of motion from 0-130 degrees. No thumb pressure was required for patellar  tracking. At this point, all trial components were removed, a double batch of DePuy HV cement with 1500 mg of Zinacef was mixed and applied to all bony metallic mating surfaces except for the posterior condyles of the femur itself. In order, we  hammered into place the tibial MBT tray with a 14 x 75 stem and removed excess cement, the femoral component and removed excess cement, a 10-mm Sigma RP bearing  was inserted, and the knee brought to full extension with compression.  The patellar button was clamped into place, and excess cement  removed. While the cement cured the wound was irrigated out with normal saline solution pulse lavage, and medium Hemovac drains were placed from an anterolateral  approach. Ligament stability and patellar tracking were checked and found to be excellent. The parapatellar arthrotomy was closed with  running #1 Vicryl suture. The subcutaneous  tissue with 0 and 2-0 undyed  Vicryl suture, and the skin with skin staples. A dressing of Xeroform,  4 x 4, dressing sponges, Webril, and Ace wrap applied. The patient  awakened, extubated, and taken to recovery room without difficulty.   Mandi Mattioli J 09/04/2012, 9:18 AM

## 2012-09-04 NOTE — Anesthesia Postprocedure Evaluation (Signed)
  Anesthesia Post-op Note  Patient: Ryan Branch  Procedure(s) Performed: Procedure(s) (LRB) with comments: TOTAL KNEE ARTHROPLASTY (Left)  Patient Location: PACU  Anesthesia Type:GA combined with regional for post-op pain  Level of Consciousness: awake, alert , oriented and patient cooperative  Airway and Oxygen Therapy: Patient Spontanous Breathing  Post-op Pain: mild  Post-op Assessment: Post-op Vital signs reviewed, Patient's Cardiovascular Status Stable, Respiratory Function Stable, Patent Airway, No signs of Nausea or vomiting and Pain level controlled  Post-op Vital Signs: stable  Complications: No apparent anesthesia complications

## 2012-09-05 ENCOUNTER — Encounter (HOSPITAL_COMMUNITY): Payer: Self-pay | Admitting: Orthopedic Surgery

## 2012-09-05 LAB — BASIC METABOLIC PANEL
BUN: 11 mg/dL (ref 6–23)
CO2: 29 mEq/L (ref 19–32)
Calcium: 8.6 mg/dL (ref 8.4–10.5)
Creatinine, Ser: 0.79 mg/dL (ref 0.50–1.35)
Glucose, Bld: 143 mg/dL — ABNORMAL HIGH (ref 70–99)

## 2012-09-05 LAB — CBC
HCT: 35.2 % — ABNORMAL LOW (ref 39.0–52.0)
Hemoglobin: 11.3 g/dL — ABNORMAL LOW (ref 13.0–17.0)
MCH: 29.8 pg (ref 26.0–34.0)
MCV: 92.9 fL (ref 78.0–100.0)
RBC: 3.79 MIL/uL — ABNORMAL LOW (ref 4.22–5.81)

## 2012-09-05 MED ORDER — OXYCODONE-ACETAMINOPHEN 5-325 MG PO TABS: 1.0000 | ORAL_TABLET | ORAL | Status: DC | PRN

## 2012-09-05 MED ORDER — ASPIRIN 325 MG PO TBEC: 325.0000 mg | DELAYED_RELEASE_TABLET | Freq: Two times a day (BID) | ORAL | Status: DC

## 2012-09-05 MED ORDER — METHOCARBAMOL 500 MG PO TABS: 500.0000 mg | ORAL_TABLET | Freq: Four times a day (QID) | ORAL | Status: DC | PRN

## 2012-09-05 NOTE — Care Management Note (Signed)
    Page 1 of 1   09/05/2012     11:42:30 AM   CARE MANAGEMENT NOTE 09/05/2012  Patient:  Ryan Branch, Ryan Branch   Account Number:  [id number]  Date Initiated:  09/05/2012  Documentation initiated by:  Anette Guarneri  Subjective/Objective Assessment:   POD#1 s/p left TKA  MD office pre-arranged [facility] services  has DME  CPM per TnT     Action/Plan:   home with [facility] serivces   Anticipated DC Date:  09/06/2012   Anticipated DC Plan:  HOME W HOME HEALTH SERVICES      DC Planning Services  CM consult      Choice offered to / List presented to:             Status of service:  Completed, signed off Medicare Important Message given?  NO (If response is "NO", the following Medicare IM given date fields will be blank) Date Medicare IM given:   Date Additional Medicare IM given:    Discharge Disposition:  HOME W HOME HEALTH SERVICES  Per UR Regulation:  Reviewed for med. necessity/level of care/duration of stay  If discussed at Long Length of Stay Meetings, dates discussed:    Comments:  09/05/12 11:40 Anette Guarneri RN/CM spoke with patient regarding d/c needs. MD office pre-arranged HHPT w/[facility] per Mr. Easler she has RW, 3n1 and shower chair with back. CPM per TnT

## 2012-09-05 NOTE — Plan of Care (Signed)
Problem: Consults Goal: Diagnosis- Total Joint Replacement Primary Total Knee     

## 2012-09-05 NOTE — Evaluation (Signed)
Occupational Therapy Evaluation Patient Details Name: Ryan Branch MRN: [id number] DOB: 05/17/1951 Today's Date: 09/05/2012 Time: 4098-1191 OT Time Calculation (min): 20 min  OT Assessment / Plan / Recommendation Clinical Impression  Pt s/p Lt TKA. All education completed and questions answered. Advised pt to hold off on showering at least 2 or more days due to inability to completely weight shift safely. No DME needs at this time. Will sign off.    OT Assessment  Patient does not need any further OT services    Follow Up Recommendations  No OT follow up;Supervision/Assistance - 24 hour    Barriers to Discharge      Equipment Recommendations  None recommended by OT    Recommendations for Other Services    Frequency       Precautions / Restrictions Precautions Precautions: Knee Restrictions LLE Weight Bearing: Weight bearing as tolerated (Simultaneous filing. User may not have seen previous data.)   Pertinent Vitals/Pain Pt reports LLE soreness but denies pain    ADL  Grooming: Wash/dry hands;Min guard Where Assessed - Grooming: Supported standing (forearms on sink for support) Lower Body Dressing: Moderate assistance Where Assessed - Lower Body Dressing: Unsupported sit to stand Toilet Transfer: Supervision/safety Toilet Transfer Method: Sit to stand Toilet Transfer Equipment: Bedside commode Transfers/Ambulation Related to ADLs: Min guard/S with RW ambulation.  ADL Comments: Pt unable to [facility] RLE to perform tub/shower transfer at this time. Advised pt to wait to attempt tub/shower transfer for at least two more days. In the mean time, instructed pt to perform weight shifting/knee lifts with supervision and RW in prep for shower transfer. Pt verbalized/demonstrated understanding. Pt with difficulty dressing due to inability to bend over and lift feet off ground simultaneously. Educated pt on dressing at EOB or with use of reacher. Wife also available to assist as  needed.     OT Diagnosis:    OT Problem List:   OT Treatment Interventions:     OT Goals    Visit Information  Last OT Received On: 09/05/12    Subjective Data  Subjective: this isn't my first rodeo Patient Stated Goal: Return home with wife   Prior Functioning     Home Living Lives With: Spouse Available Help at Discharge: Available 24 hours/day;Family Type of Home: House Home Access: Ramped entrance Home Layout: One level Bathroom Toilet: Handicapped height Bathroom Accessibility: Yes How Accessible: Accessible via walker Home Adaptive Equipment: Bedside commode/3-in-1;Walker - rolling;Shower chair with back Additional Comments: pt had other knee replaced 2 yrs ago Prior Function Level of Independence: Independent Able to Take Stairs?: No Driving: Yes Vocation: Full time employment Communication Communication: No difficulties Dominant Hand: Right (able to use left due to old Rt injury)         Vision/Perception     Cognition  Overall Cognitive Status: Appears within functional limits for tasks assessed/performed (Simultaneous filing. User may not have seen previous data.) Arousal/Alertness: Awake/alert (Simultaneous filing. User may not have seen previous data.) Orientation Level: Oriented X4 / Intact (Simultaneous filing. User may not have seen previous data.) Behavior During Session: [facility] for tasks performed (Simultaneous filing. User may not have seen previous data.)    Extremity/Trunk Assessment Right Upper Extremity Assessment RUE ROM/Strength/Tone: WFL for tasks assessed (h/o old injury. pt states he is 25% disabled in Rt hand) Left Upper Extremity Assessment LUE ROM/Strength/Tone: [facility] for tasks assessed     Mobility Bed Mobility Supine to Sit: 5: Supervision;With rails Transfers Sit to Stand: 4: Min guard;5: Supervision;With  armrests;From chair/3-in-1 Stand to Sit: 4: Min guard;With armrests;To chair/3-in-1 Details for Transfer Assistance: min  guard for safety. good hand placement     Shoulder Instructions     Exercise     Balance     End of Session OT - End of Session Equipment Utilized During Treatment: Gait belt Activity Tolerance: Patient tolerated treatment well Patient left: in chair;with call bell/phone within reach;with family/visitor present Nurse Communication: Mobility status  GO     Danelle Curiale 09/05/2012, 4:52 PM

## 2012-09-05 NOTE — Discharge Summary (Signed)
Patient ID: Ryan Branch MRN: [id number] DOB/AGE: [birth year] 59 y.o.  Admit date: 09/04/2012 Discharge date: 09/05/2012  Admission Diagnoses:  Principal Problem:  *Arthritis of knee, left Varus   Discharge Diagnoses:  Same  Past Medical History  Diagnosis Date  . PONV (postoperative nausea and vomiting)     with a patch  . Hypothyroidism   . Hypertension   . H/O hiatal hernia   . Hyperlipemia   . Shortness of breath   . Protein in urine     x 1  . Sleep apnea     . 10 - 12 yeasr ago at [employer].  Marland Kitchen Arthritis     Surgeries: Procedure(s): TOTAL KNEE ARTHROPLASTY on 09/04/2012   Consultants:    Discharged Condition: Improved  Hospital Course: Ryan Branch is an 61 y.o. male who was admitted 09/04/2012 for operative treatment ofArthritis of knee, left. Patient has severe unremitting pain that affects sleep, daily activities, and work/hobbies. After pre-op clearance the patient was taken to the operating room on 09/04/2012 and underwent  Procedure(s): TOTAL KNEE ARTHROPLASTY.    Patient was given perioperative antibiotics: Anti-infectives     Start     Dose/Rate Route Frequency Ordered Stop   09/04/12 0750   cefUROXime (ZINACEF) injection  Status:  Discontinued          As needed 09/04/12 0750 09/04/12 0942   09/03/12 1406   ceFAZolin (ANCEF) 3 g in dextrose 5 % 50 mL IVPB        3 g 160 mL/hr over 30 Minutes Intravenous 60 min pre-op 09/03/12 1406 09/04/12 0745           Patient was given sequential compression devices, early ambulation, and chemoprophylaxis to prevent DVT.  Patient benefited maximally from hospital stay and there were no complications.    Recent vital signs: Patient Vitals for the past 24 hrs:  BP Temp Pulse Resp SpO2  09/05/12 1155 - - - 13  94 %  09/05/12 0800 - - - 12  94 %  09/05/12 0159 109/47 mmHg 98.5 F (36.9 C) 57  18  100 %  09/04/12 2250 - - 77  20  100 %  09/04/12 2115 112/47 mmHg 98.1 F (36.7 C) 74  20  95 %   09/04/12 1847 146/72 mmHg 98.4 F (36.9 C) 72  20  100 %  09/04/12 1600 - - - 13  95 %     Recent laboratory studies:  Basename 09/05/12 0518  WBC 9.8  HGB 11.3*  HCT 35.2*  PLT 223  NA 141  K 4.2  CL 104  CO2 29  BUN 11  CREATININE 0.79  GLUCOSE 143*  INR --  CALCIUM 8.6     Discharge Medications:     Medication List     As of 09/05/2012  3:41 PM    TAKE these medications         allopurinol 300 MG tablet   Commonly known as: ZYLOPRIM   Take 300 mg by mouth daily.      aspirin 325 MG EC tablet   Take 1 tablet (325 mg total) by mouth 2 (two) times daily.      CENTRUM SILVER ULTRA MENS PO   Take 1 tablet by mouth every morning.      fenofibrate micronized 200 MG capsule   Commonly known as: LOFIBRA   Take 200 mg by mouth at bedtime.      furosemide 80 MG tablet   Commonly  known as: LASIX   Take 80 mg by mouth daily.      levothyroxine 75 MCG tablet   Commonly known as: SYNTHROID, LEVOTHROID   Take 75 mcg by mouth daily.      lisinopril 20 MG tablet   Commonly known as: PRINIVIL,ZESTRIL   Take 20 mg by mouth at bedtime.      methocarbamol 500 MG tablet   Commonly known as: ROBAXIN   Take 1 tablet (500 mg total) by mouth every 6 (six) hours as needed.      OMEGA 3-6-9 COMPLEX PO   Take 2 capsules by mouth every morning.      oxyCODONE-acetaminophen 5-325 MG per tablet   Commonly known as: PERCOCET/ROXICET   Take 1-2 tablets by mouth every 4 (four) hours as needed for pain.      potassium chloride 10 MEQ tablet   Commonly known as: K-DUR   Take 10 mEq by mouth daily.      simvastatin 20 MG tablet   Commonly known as: ZOCOR   Take 20 mg by mouth at bedtime.        Diagnostic Studies: Dg Chest 2 View  08/30/2012  *RADIOLOGY REPORT*  Clinical Data: Preop radiograph  CHEST - 2 VIEW  Comparison: 04/17/2010  Findings: The heart size is mildly enlarged.  The mediastinal contours are within normal limits.  Both lungs are clear.  The visualized  skeletal structures are unremarkable.  IMPRESSION:  1.  No active cardiopulmonary abnormalities.   Original Report Authenticated By: Signa Kell, M.D.    Dg Knee Left Port  09/04/2012  *RADIOLOGY REPORT*  Clinical Data: Status post left knee replacement.  PORTABLE LEFT KNEE - 1-2 VIEW  Comparison: No priors.  Findings: Postoperative changes of left total knee arthroplasty are noted.  The femoral and tibial components of the prosthesis appear to be well seated without definite periprosthetic fracture or other immediate complicating features.  There is a small amount of gas in the overlying soft tissues and a surgical drain in place with overlying skin staples.  IMPRESSION: 1.  Expected postoperative changes of left total knee arthroplasty, as above, without acute complicating features.   Original Report Authenticated By: Trudie Reed, M.D.     Disposition: 06-Home-Health Care Svc      Discharge Orders    Future Orders Please Complete By Expires   Increase activity slowly      Walker       May shower / Bathe      Driving Restrictions      Comments:   No driving for 2 weeks.   Change dressing (specify)      Comments:   Dressing change as needed.   Call MD for:  temperature >100.4      Call MD for:  severe uncontrolled pain      Call MD for:  redness, tenderness, or signs of infection (pain, swelling, redness, odor or green/yellow discharge around incision site)      Discharge instructions      Comments:   F/U with Dr. Turner Daniels as scheduled.         SignedHazle Nordmann. 09/05/2012, 3:41 PM

## 2012-09-05 NOTE — Progress Notes (Signed)
Pt is safe to d/c home from a PT standpoint at this time. He was given discharge instructions and prescriptions. Patient will be going home with HHPT and HHRN with the support of his wife.

## 2012-09-05 NOTE — Progress Notes (Signed)
Patient ID: Ryan Branch, male   DOB: 01/24/1951, 61 y.o.   MRN: [id number] PATIENT ID: Ryan Branch  MRN: [id number]  DOB/AGE:  Jan 17, 1951 / 61 y.o.  1 Day Post-Op Procedure(s) (LRB): TOTAL KNEE ARTHROPLASTY (Left)    PROGRESS NOTE Subjective: Patient is alert, oriented, no Nausea, no Vomiting, yes passing gas, no Bowel Movement. Taking PO well. Denies SOB, Chest or Calf Pain. Using Incentive Spirometer, PAS in place. Ambulate WBAT, CPM 0-30 Patient reports pain as 2 on 0-10 scale  .    Objective: Vital signs in last 24 hours: Filed Vitals:   09/04/12 1847 09/04/12 2115 09/04/12 2250 09/05/12 0159  BP: 146/72 112/47  109/47  Pulse: 72 74 77 57  Temp: 98.4 F (36.9 C) 98.1 F (36.7 C)  98.5 F (36.9 C)  Resp: 20 20 20 18   SpO2: 100% 95% 100% 100%      Intake/Output from previous day: I/O last 3 completed shifts: In: 4120 [P.O.:1320; I.V.:2800] Out: 5660 [Urine:5150; Drains:500; Blood:10]   Intake/Output this shift:     LABORATORY DATA:  Basename 09/05/12 0518  WBC 9.8  HGB 11.3*  HCT 35.2*  PLT 223  NA 141  K 4.2  CL 104  CO2 29  BUN 11  CREATININE 0.79  GLUCOSE 143*  GLUCAP --  INR --  CALCIUM 8.6    Examination: Neurologically intact ABD soft Neurovascular intact Sensation intact distally Intact pulses distally Dorsiflexion/Plantar flexion intact Incision: scant drainage No cellulitis present Compartment soft} Blood and plasma separated in drain indicating minimal recent drainage, drain pulled without difficulty.  Assessment:   1 Day Post-Op Procedure(s) (LRB): TOTAL KNEE ARTHROPLASTY (Left) ADDITIONAL DIAGNOSIS:  Morbid super obesity (BMI 55)  Plan: PT/OT WBAT, CPM 5/hrs day until ROM 0-90 degrees, then D/C CPM DVT Prophylaxis:  SCDx72hrs, ASA 325 mg BID x 2 weeks DISCHARGE PLAN: Home DISCHARGE NEEDS: HHPT, HHRN, CPM, Walker and 3-in-1 comode seat     Charice Zuno J 09/05/2012, 7:35 AM

## 2012-09-05 NOTE — Progress Notes (Signed)
Physical Therapy Treatment Patient Details Name: Ryan Branch MRN: [id number] DOB: [birth year] Today's Date: 09/05/2012 Time: 4098-1191 PT Time Calculation (min): 25 min  PT Assessment / Plan / Recommendation Comments on Treatment Session   Pt appropriate to d/c home.     Follow Up Recommendations  Home health PT;Supervision - Intermittent     Does the patient have the potential to tolerate intense rehabilitation     Barriers to Discharge        Equipment Recommendations  None recommended by PT    Recommendations for Other Services    Frequency 7X/week   Plan All goals met and education completed, patient dischaged from PT services    Precautions / Restrictions Precautions Precautions: Knee Restrictions Weight Bearing Restrictions: Yes LLE Weight Bearing: Weight bearing as tolerated (Simultaneous filing. User may not have seen previous data.)   Pertinent Vitals/Pain No c/o pain    Mobility  Bed Mobility Bed Mobility: Supine to Sit;Sit to Supine Supine to Sit: 6: Modified independent (Device/Increase time) Sitting - Scoot to Edge of Bed: 7: Independent Sit to Supine: 6: Modified independent (Device/Increase time) Transfers Transfers: Sit to Stand;Stand to Sit Sit to Stand: 5: Supervision Stand to Sit: 5: Supervision Details for Transfer Assistance: cues to sit on edge of chair and not flop into chair.  Ambulation/Gait Ambulation/Gait Assistance: 6: Modified independent (Device/Increase time) Ambulation Distance (Feet): 150 Feet Assistive device: Rolling walker Gait Pattern: Step-to pattern;Decreased stance time - left Stairs: No Wheelchair Mobility Wheelchair Mobility: No    Exercises Total Joint Exercises Ankle Circles/Pumps: AROM;Both;20 reps;Seated Quad Sets: AROM;Both;10 reps;Seated Short Arc Quad: 10 reps;AAROM;Left Heel Slides: Left;10 reps Straight Leg Raises: Left;10 reps   PT Diagnosis:    PT Problem List:   PT Treatment Interventions:      PT Goals Acute Rehab PT Goals PT Goal Formulation: With patient Time For Goal Achievement: 09/11/12 Potential to Achieve Goals: Good Pt will go Supine/Side to Sit: with modified independence PT Goal: Supine/Side to Sit - Progress: Met Pt will go Sit to Supine/Side: with modified independence PT Goal: Sit to Supine/Side - Progress: Met Pt will go Sit to Stand: with modified independence PT Goal: Sit to Stand - Progress: Met Pt will go Stand to Sit: with modified independence PT Goal: Stand to Sit - Progress: Met Pt will Ambulate: >150 feet;with modified independence;with rolling walker PT Goal: Ambulate - Progress: Met Pt will Perform Home Exercise Program: Independently PT Goal: Perform Home Exercise Program - Progress: Met  Visit Information  Last PT Received On: 09/05/12 Assistance Needed: +1    Subjective Data  Subjective: Ready to go Patient Stated Goal: return to home and work   Cognition  Overall Cognitive Status: Appears within functional limits for tasks assessed/performed (Simultaneous filing. User may not have seen previous data.) Arousal/Alertness: Awake/alert (Simultaneous filing. User may not have seen previous data.) Orientation Level: Oriented X4 / Intact (Simultaneous filing. User may not have seen previous data.) Behavior During Session: [facility] for tasks performed (Simultaneous filing. User may not have seen previous data.)    Balance  Balance Balance Assessed: No  End of Session PT - End of Session Equipment Utilized During Treatment: Gait belt Activity Tolerance: Patient tolerated treatment well Patient left: in bed;in CPM;with call bell/phone within reach Nurse Communication: Mobility status CPM Left Knee CPM Left Knee: On Left Knee Flexion (Degrees): 60  Left Knee Extension (Degrees): 0  Additional Comments: CPM on at 1315   GP     Bailei Buist 09/05/2012, 5:01  PM Charlisha Market L. Jelitza Manninen DPT [phone]

## 2014-02-10 IMAGING — CR DG KNEE 1-2V PORT*L*
2 series · 2 of 2 positions shown · non-contrast
Comparison: No priors.

CLINICAL DATA: Status post left knee replacement.

PORTABLE LEFT KNEE - 1-2 VIEW

[AP]
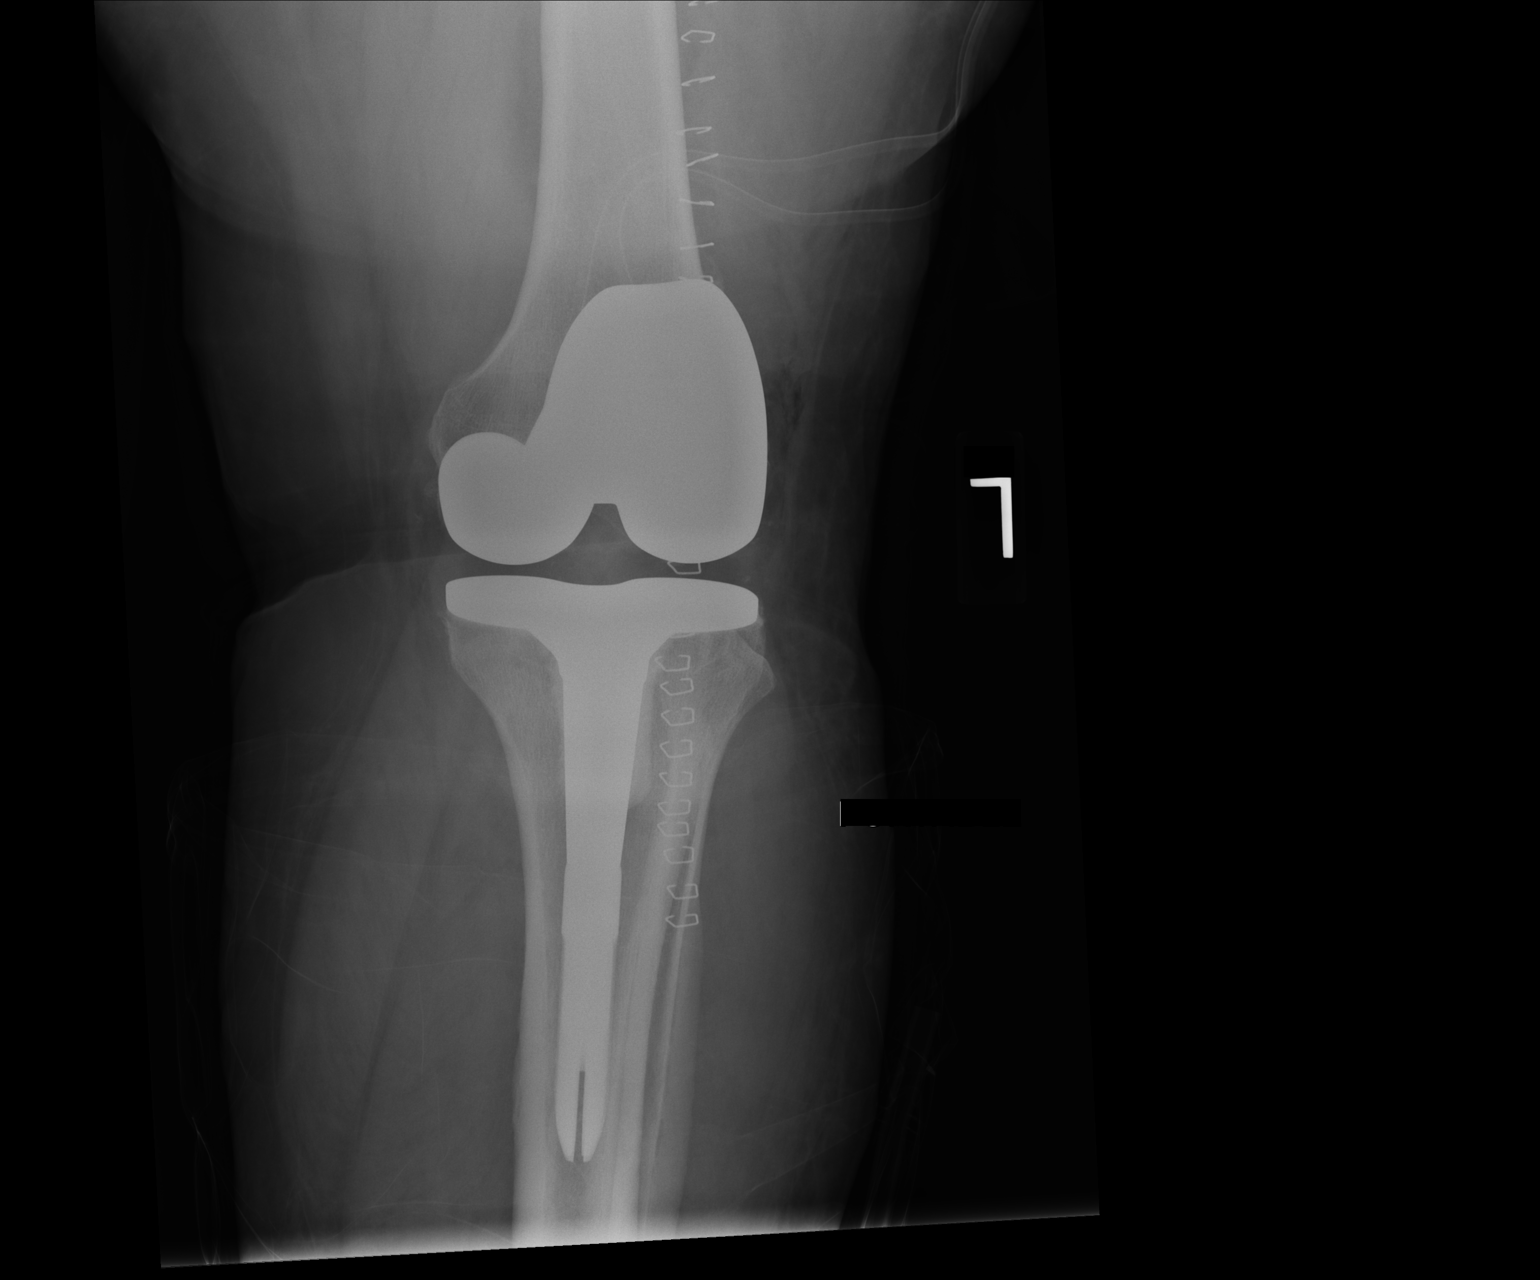

[xtable lateral]
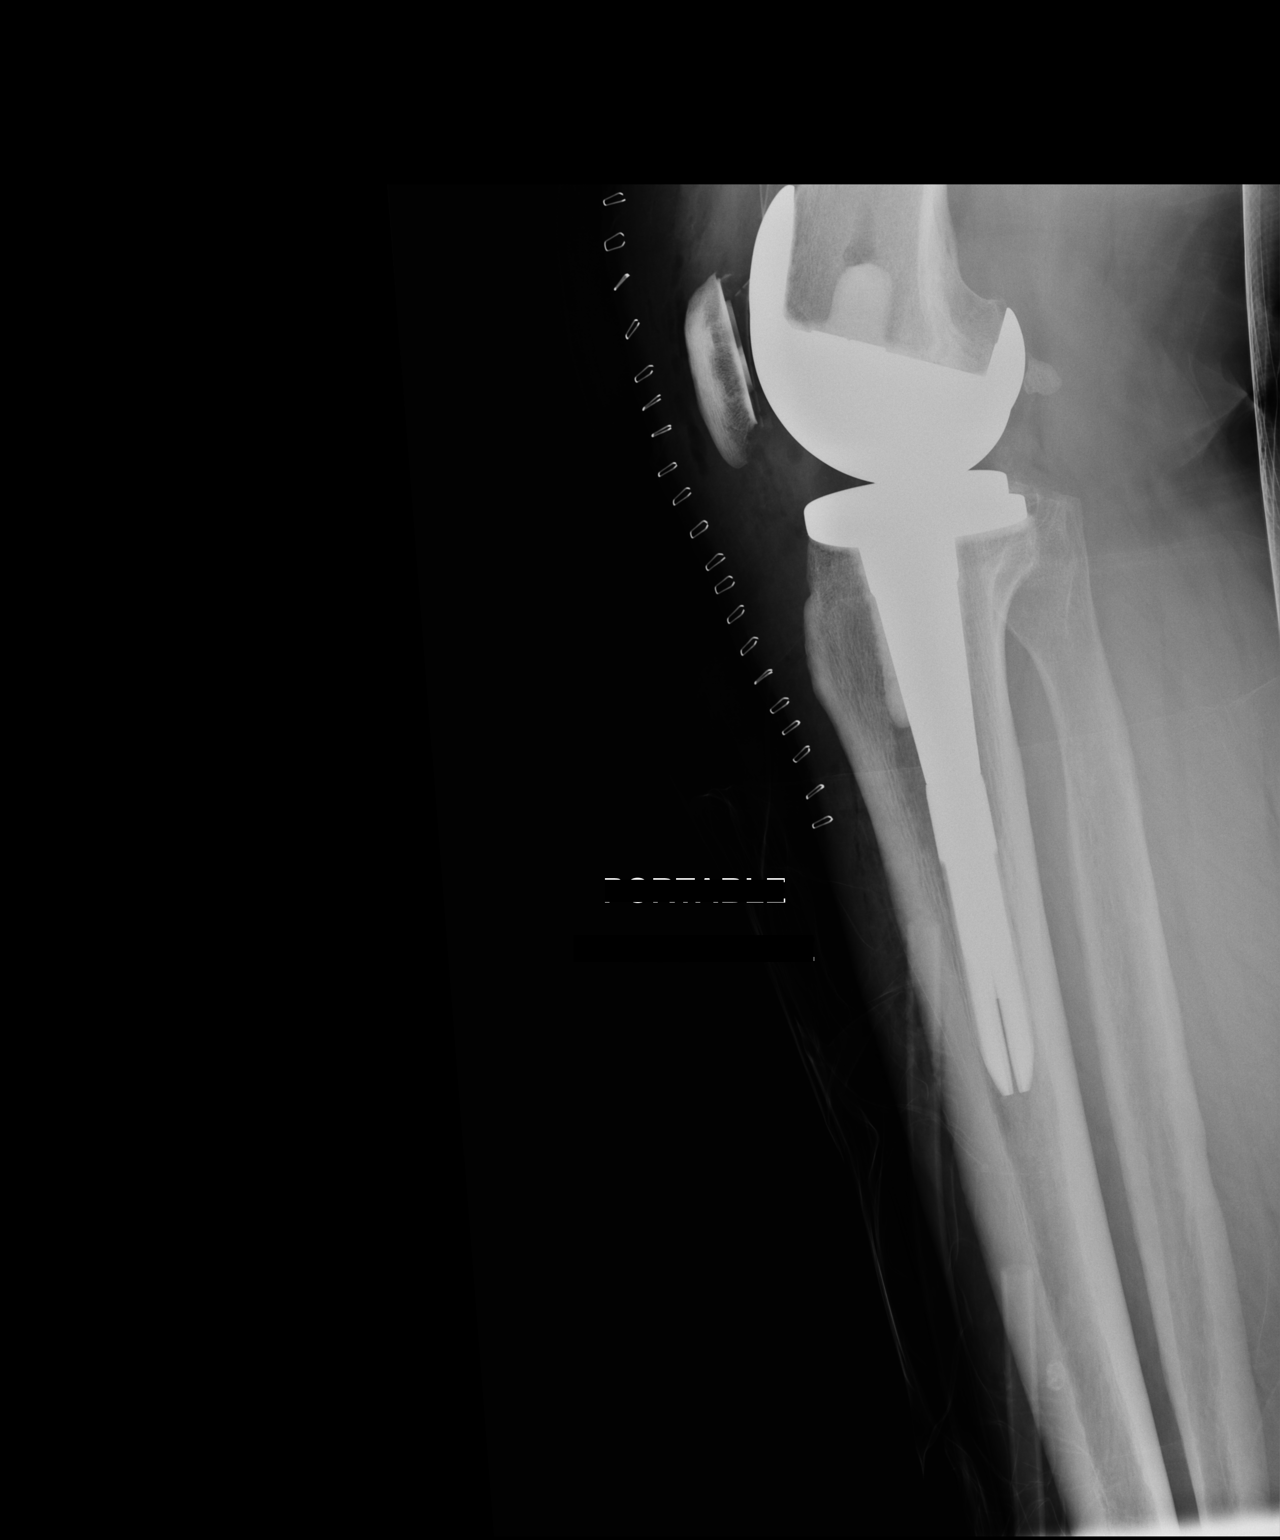

[2 of 2 positions shown; findings below may reference images not displayed]

FINDINGS: Postoperative changes of left total knee arthroplasty are
noted.  The femoral and tibial components of the prosthesis appear
to be well seated without definite periprosthetic fracture or other
immediate complicating features.  There is a small amount of gas in
the overlying soft tissues and a surgical drain in place with
overlying skin staples.
IMPRESSION: 1.  Expected postoperative changes of left total knee arthroplasty,
as above, without acute complicating features.

## 2017-09-01 ENCOUNTER — Other Ambulatory Visit: Payer: Self-pay

## 2017-09-01 DIAGNOSIS — R609 Edema, unspecified: Secondary | ICD-10-CM

## 2017-10-19 ENCOUNTER — Encounter: Payer: Self-pay | Admitting: Vascular Surgery

## 2017-10-19 ENCOUNTER — Ambulatory Visit (HOSPITAL_COMMUNITY)
Admission: RE | Admit: 2017-10-19 | Discharge: 2017-10-19 | Disposition: A | Discharge status: Home / Self Care | Payer: BLUE CROSS/BLUE SHIELD | Source: Ambulatory Visit | Attending: Vascular Surgery | Admitting: Vascular Surgery

## 2017-10-19 ENCOUNTER — Ambulatory Visit (INDEPENDENT_AMBULATORY_CARE_PROVIDER_SITE_OTHER): Payer: BLUE CROSS/BLUE SHIELD | Admitting: Vascular Surgery

## 2017-10-19 VITALS — BP 180/63 | HR 66 | Temp 97.1°F | Resp 22 | Ht 67.0 in | Wt 361.0 lb

## 2017-10-19 DIAGNOSIS — I872 Venous insufficiency (chronic) (peripheral): Secondary | ICD-10-CM | POA: Diagnosis not present

## 2017-10-19 DIAGNOSIS — R609 Edema, unspecified: Secondary | ICD-10-CM | POA: Diagnosis present

## 2017-10-19 NOTE — Progress Notes (Signed)
Patient name: Ryan Branch MRN: [id number] DOB: 1951-02-01 Sex: male   REASON FOR CONSULT:    Bilateral lower extremity edema.  The consult is requested by Dr. York Grice.  HPI:   Ryan Branch is a pleasant 67 y.o. male, who was referred with a long history of bilateral lower extremity edema.  He states that he had swelling in his leg for years.  He has had bilateral total knee replacements.  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  He denies any previous history of DVT or phlebitis.  He describes some aching pain and heaviness in his legs which is aggravated by standing and relieved somewhat with elevation.  He has been wearing compression stockings.  He describes some pain in the pretibial area bilaterally when he ambulates  Past Medical History:  Diagnosis Date  . Arthritis   . H/O hiatal hernia   . Hyperlipemia   . Hypertension   . Hypothyroidism   . PONV (postoperative nausea and vomiting)    with a patch  . Protein in urine    x 1  . Shortness of breath   . Sleep apnea    . 10 - 12 yeasr ago at [employer].    History reviewed. No pertinent family history.  He denies any family history of premature cardiovascular disease.  SOCIAL HISTORY: He is not a smoker. Social History   Socioeconomic History  . Marital status: Married    Spouse name: Not on file  . Number of children: Not on file  . Years of education: Not on file  . Highest education level: Not on file  Social Needs  . Financial resource strain: Not on file  . Food insecurity - worry: Not on file  . Food insecurity - inability: Not on file  . Transportation needs - medical: Not on file  . Transportation needs - non-medical: Not on file  Occupational History  . Not on file  Tobacco Use  . Smoking status: Former Smoker    Years: 2.00  . Smokeless tobacco: Never Used  . Tobacco comment: quit 40 years ago.  Substance and Sexual Activity  . Alcohol use:  No  . Drug use: No  . Sexual activity: Not on file  Other Topics Concern  . Not on file  Social History Narrative  . Not on file    Allergies  Allergen Reactions  . Latex Itching and Rash    Current Outpatient Medications  Medication Sig Dispense Refill  . allopurinol (ZYLOPRIM) 300 MG tablet Take 300 mg by mouth daily.    Marland Kitchen aspirin EC 81 MG tablet Take 81 mg by mouth daily. 2 tabs Qhs    . ergocalciferol (VITAMIN D2) 50000 units capsule Take 50,000 Units by mouth once a week. 2 tabs PO Q week.    . fenofibrate micronized (LOFIBRA) 200 MG capsule Take 200 mg by mouth at bedtime.    . furosemide (LASIX) 80 MG tablet Take 80 mg by mouth daily.    Marland Kitchen levothyroxine (SYNTHROID, LEVOTHROID) 75 MCG tablet Take 75 mcg by mouth daily.    Marland Kitchen lisinopril (PRINIVIL,ZESTRIL) 20 MG tablet Take 10 mg by mouth at bedtime.     . Multiple Vitamins-Minerals (CENTRUM SILVER ULTRA MENS PO) Take 1 tablet by mouth every morning.    . Omega 3-6-9 Fatty Acids (OMEGA 3-6-9 COMPLEX PO) Take 2 capsules by mouth every morning.    . potassium chloride (K-DUR) 10  MEQ tablet Take 10 mEq by mouth daily.    . simvastatin (ZOCOR) 20 MG tablet Take 20 mg by mouth at bedtime.    . tamsulosin (FLOMAX) 0.4 MG CAPS capsule Take 0.4 mg by mouth.     No current facility-administered medications for this visit.     REVIEW OF SYSTEMS:  [X]  denotes positive finding, [ ]  denotes negative finding Cardiac  Comments:  Chest pain or chest pressure:    Shortness of breath upon exertion:    Short of breath when lying flat:    Irregular heart rhythm:        Vascular    Pain in calf, thigh, or hip brought on by ambulation: x   Pain in feet at night that wakes you up from your sleep:  x   Blood clot in your veins:    Leg swelling:  x       Pulmonary    Oxygen at home:    Productive cough:     Wheezing:         Neurologic    Sudden weakness in arms or legs:     Sudden numbness in arms or legs:     Sudden onset of  difficulty speaking or slurred speech:    Temporary loss of vision in one eye:     Problems with dizziness:         Gastrointestinal    Blood in stool:     Vomited blood:         Genitourinary    Burning when urinating:     Blood in urine:        Psychiatric    Major depression:         Hematologic    Bleeding problems:    Problems with blood clotting too easily:        Skin    Rashes or ulcers:        Constitutional    Fever or chills:     PHYSICAL EXAM:   Vitals:   10/19/17 1126  BP: (!) 180/63  Pulse: 66  Resp: (!) 22  Temp: (!) 97.1 F (36.2 C)  TempSrc: Oral  SpO2: 98%  Weight: (!) 361 lb (163.7 kg)  Height: 5\' 7"  (1.702 m)   Body mass index is 56.54 kg/m.  GENERAL: The patient is a well-nourished male, in no acute distress. The vital signs are documented above. CARDIAC: There is a regular rate and rhythm.  VASCULAR: I do not detect carotid bruits. He has palpable dorsalis pedis pulses bilaterally. He has bilateral lower extremity swelling. He has hyperpigmentation bilaterally consistent with chronic venous insufficiency. He has some reticular veins bilaterally. PULMONARY: There is good air exchange bilaterally without wheezing or rales. ABDOMEN: Soft and non-tender with normal pitched bowel sounds.  MUSCULOSKELETAL: There are no major deformities or cyanosis. NEUROLOGIC: No focal weakness or paresthesias are detected. SKIN: There are no ulcers or rashes noted. PSYCHIATRIC: The patient has a normal affect.  DATA:    BILATERAL LOWER EXTREMITY VENOUS REFLUX DUPLEX: I have independently interpreted the lower extremity venous reflux study.  On the right side, there is no evidence of deep venous thrombosis or superficial thrombophlebitis.  There is significant deep venous reflux noted in the common femoral vein and femoral vein.  There is reflux in the great saphenous vein at the knee and in the proximal calf.  There is also reflux in the small saphenous  vein at the saphenofemoral popliteal junction.  On the  left side there is no evidence of deep venous thrombosis or superficial thrombophlebitis.  There is no significant deep venous reflux.  There is reflux in the great saphenous vein in the groin.  MEDICAL ISSUES:   CHRONIC VENOUS INSUFFICIENCY: Based on his duplex scan he does clearly have evidence of chronic venous insufficiency with mostly deep venous reflux.  He has no evidence of DVT.  I would agree that he also has lipedema.  We have discussed the importance of intermittent leg elevation and the proper positioning for this.  He does have compression stockings and I have encouraged him to wear these.  I discussed with him the importance of exercise and nutrition and I think weight loss would help significantly.  Especially with his central obesity this causes increased venous pressure.  I have asked him to consider water aerobics which I think is also very helpful for patients with chronic venous insufficiency.  If his swelling worsens I be happy to see him back at any time.  The only other consideration would be a lymphatic pump if he developed problems with progressive venous insufficiency.  I think in reality the weight loss would probably be the most significant change that he could make to help with his leg swelling.  Waverly Ferrari [facility] [phone]

## 2018-03-09 DIAGNOSIS — N051 Unspecified nephritic syndrome with focal and segmental glomerular lesions: Secondary | ICD-10-CM | POA: Diagnosis not present

## 2018-03-09 DIAGNOSIS — Z9989 Dependence on other enabling machines and devices: Secondary | ICD-10-CM | POA: Diagnosis not present

## 2018-03-09 DIAGNOSIS — G4733 Obstructive sleep apnea (adult) (pediatric): Secondary | ICD-10-CM | POA: Diagnosis not present

## 2018-03-09 DIAGNOSIS — N400 Enlarged prostate without lower urinary tract symptoms: Secondary | ICD-10-CM | POA: Diagnosis not present

## 2018-03-09 DIAGNOSIS — R5383 Other fatigue: Secondary | ICD-10-CM | POA: Diagnosis not present

## 2018-03-09 DIAGNOSIS — R5381 Other malaise: Secondary | ICD-10-CM | POA: Diagnosis not present

## 2018-03-09 DIAGNOSIS — E782 Mixed hyperlipidemia: Secondary | ICD-10-CM | POA: Diagnosis not present

## 2018-03-09 DIAGNOSIS — Z8739 Personal history of other diseases of the musculoskeletal system and connective tissue: Secondary | ICD-10-CM | POA: Diagnosis not present

## 2018-03-09 DIAGNOSIS — N401 Enlarged prostate with lower urinary tract symptoms: Secondary | ICD-10-CM | POA: Diagnosis not present

## 2018-03-09 DIAGNOSIS — R600 Localized edema: Secondary | ICD-10-CM | POA: Diagnosis not present

## 2018-03-09 DIAGNOSIS — Z87891 Personal history of nicotine dependence: Secondary | ICD-10-CM | POA: Diagnosis not present

## 2018-03-09 DIAGNOSIS — Z0000 Encounter for general adult medical examination without abnormal findings: Secondary | ICD-10-CM | POA: Diagnosis not present

## 2018-03-09 DIAGNOSIS — N138 Other obstructive and reflux uropathy: Secondary | ICD-10-CM | POA: Diagnosis not present

## 2018-03-09 DIAGNOSIS — E039 Hypothyroidism, unspecified: Secondary | ICD-10-CM | POA: Diagnosis not present

## 2018-03-09 DIAGNOSIS — M150 Primary generalized (osteo)arthritis: Secondary | ICD-10-CM | POA: Diagnosis not present

## 2018-03-09 DIAGNOSIS — R7309 Other abnormal glucose: Secondary | ICD-10-CM | POA: Diagnosis not present

## 2018-03-09 DIAGNOSIS — R079 Chest pain, unspecified: Secondary | ICD-10-CM | POA: Diagnosis not present

## 2018-03-09 DIAGNOSIS — M1990 Unspecified osteoarthritis, unspecified site: Secondary | ICD-10-CM | POA: Diagnosis not present

## 2018-03-09 DIAGNOSIS — I10 Essential (primary) hypertension: Secondary | ICD-10-CM | POA: Diagnosis not present

## 2018-03-09 DIAGNOSIS — E559 Vitamin D deficiency, unspecified: Secondary | ICD-10-CM | POA: Diagnosis not present

## 2018-03-09 DIAGNOSIS — N269 Renal sclerosis, unspecified: Secondary | ICD-10-CM | POA: Diagnosis not present

## 2018-03-10 DIAGNOSIS — R5383 Other fatigue: Secondary | ICD-10-CM | POA: Diagnosis not present

## 2018-03-10 DIAGNOSIS — E782 Mixed hyperlipidemia: Secondary | ICD-10-CM | POA: Diagnosis not present

## 2018-03-10 DIAGNOSIS — R7309 Other abnormal glucose: Secondary | ICD-10-CM | POA: Diagnosis not present

## 2018-03-10 DIAGNOSIS — R5381 Other malaise: Secondary | ICD-10-CM | POA: Diagnosis not present

## 2018-03-10 DIAGNOSIS — E039 Hypothyroidism, unspecified: Secondary | ICD-10-CM | POA: Diagnosis not present

## 2018-03-10 DIAGNOSIS — I10 Essential (primary) hypertension: Secondary | ICD-10-CM | POA: Diagnosis not present

## 2018-03-10 DIAGNOSIS — E559 Vitamin D deficiency, unspecified: Secondary | ICD-10-CM | POA: Diagnosis not present

## 2018-04-24 DIAGNOSIS — Z96652 Presence of left artificial knee joint: Secondary | ICD-10-CM | POA: Diagnosis not present

## 2018-04-24 DIAGNOSIS — M25572 Pain in left ankle and joints of left foot: Secondary | ICD-10-CM | POA: Diagnosis not present

## 2018-07-16 DIAGNOSIS — R300 Dysuria: Secondary | ICD-10-CM | POA: Diagnosis not present

## 2018-07-16 DIAGNOSIS — N3000 Acute cystitis without hematuria: Secondary | ICD-10-CM | POA: Diagnosis not present

## 2018-07-16 DIAGNOSIS — N419 Inflammatory disease of prostate, unspecified: Secondary | ICD-10-CM | POA: Diagnosis not present

## 2018-07-27 DIAGNOSIS — R350 Frequency of micturition: Secondary | ICD-10-CM | POA: Diagnosis not present

## 2018-08-16 DIAGNOSIS — H66009 Acute suppurative otitis media without spontaneous rupture of ear drum, unspecified ear: Secondary | ICD-10-CM | POA: Diagnosis not present

## 2018-08-16 DIAGNOSIS — H6123 Impacted cerumen, bilateral: Secondary | ICD-10-CM | POA: Diagnosis not present

## 2018-09-05 DIAGNOSIS — R350 Frequency of micturition: Secondary | ICD-10-CM | POA: Diagnosis not present

## 2018-09-05 DIAGNOSIS — N3281 Overactive bladder: Secondary | ICD-10-CM | POA: Diagnosis not present

## 2018-09-14 DIAGNOSIS — J209 Acute bronchitis, unspecified: Secondary | ICD-10-CM | POA: Diagnosis not present

## 2018-09-14 DIAGNOSIS — J069 Acute upper respiratory infection, unspecified: Secondary | ICD-10-CM | POA: Diagnosis not present

## 2018-09-17 DIAGNOSIS — J189 Pneumonia, unspecified organism: Secondary | ICD-10-CM | POA: Diagnosis not present

## 2018-09-25 DIAGNOSIS — E559 Vitamin D deficiency, unspecified: Secondary | ICD-10-CM | POA: Diagnosis not present

## 2018-09-25 DIAGNOSIS — R05 Cough: Secondary | ICD-10-CM | POA: Diagnosis not present

## 2018-09-25 DIAGNOSIS — Z8701 Personal history of pneumonia (recurrent): Secondary | ICD-10-CM | POA: Diagnosis not present

## 2018-09-25 DIAGNOSIS — I10 Essential (primary) hypertension: Secondary | ICD-10-CM | POA: Diagnosis not present

## 2018-09-25 DIAGNOSIS — E039 Hypothyroidism, unspecified: Secondary | ICD-10-CM | POA: Diagnosis not present

## 2018-09-25 DIAGNOSIS — Z125 Encounter for screening for malignant neoplasm of prostate: Secondary | ICD-10-CM | POA: Diagnosis not present

## 2018-09-25 DIAGNOSIS — Z23 Encounter for immunization: Secondary | ICD-10-CM | POA: Diagnosis not present

## 2018-09-25 DIAGNOSIS — R7309 Other abnormal glucose: Secondary | ICD-10-CM | POA: Diagnosis not present

## 2019-03-08 DIAGNOSIS — M79605 Pain in left leg: Secondary | ICD-10-CM | POA: Diagnosis not present

## 2019-03-08 DIAGNOSIS — M79604 Pain in right leg: Secondary | ICD-10-CM | POA: Diagnosis not present

## 2019-03-12 DIAGNOSIS — M79661 Pain in right lower leg: Secondary | ICD-10-CM | POA: Diagnosis not present

## 2019-03-12 DIAGNOSIS — M79662 Pain in left lower leg: Secondary | ICD-10-CM | POA: Diagnosis not present

## 2019-03-15 DIAGNOSIS — M79662 Pain in left lower leg: Secondary | ICD-10-CM | POA: Diagnosis not present

## 2019-03-15 DIAGNOSIS — M79661 Pain in right lower leg: Secondary | ICD-10-CM | POA: Diagnosis not present

## 2019-03-20 DIAGNOSIS — M79662 Pain in left lower leg: Secondary | ICD-10-CM | POA: Diagnosis not present

## 2019-03-20 DIAGNOSIS — M79661 Pain in right lower leg: Secondary | ICD-10-CM | POA: Diagnosis not present

## 2019-03-22 DIAGNOSIS — M79661 Pain in right lower leg: Secondary | ICD-10-CM | POA: Diagnosis not present

## 2019-03-22 DIAGNOSIS — M79662 Pain in left lower leg: Secondary | ICD-10-CM | POA: Diagnosis not present

## 2019-03-26 DIAGNOSIS — M79661 Pain in right lower leg: Secondary | ICD-10-CM | POA: Diagnosis not present

## 2019-03-26 DIAGNOSIS — M79662 Pain in left lower leg: Secondary | ICD-10-CM | POA: Diagnosis not present

## 2019-03-28 DIAGNOSIS — M79661 Pain in right lower leg: Secondary | ICD-10-CM | POA: Diagnosis not present

## 2019-03-28 DIAGNOSIS — M79662 Pain in left lower leg: Secondary | ICD-10-CM | POA: Diagnosis not present

## 2019-04-02 DIAGNOSIS — M79662 Pain in left lower leg: Secondary | ICD-10-CM | POA: Diagnosis not present

## 2019-04-02 DIAGNOSIS — M79661 Pain in right lower leg: Secondary | ICD-10-CM | POA: Diagnosis not present

## 2019-04-04 DIAGNOSIS — M79662 Pain in left lower leg: Secondary | ICD-10-CM | POA: Diagnosis not present

## 2019-04-04 DIAGNOSIS — M79661 Pain in right lower leg: Secondary | ICD-10-CM | POA: Diagnosis not present

## 2019-04-09 DIAGNOSIS — M79662 Pain in left lower leg: Secondary | ICD-10-CM | POA: Diagnosis not present

## 2019-04-09 DIAGNOSIS — M79661 Pain in right lower leg: Secondary | ICD-10-CM | POA: Diagnosis not present

## 2019-04-10 DIAGNOSIS — Z96652 Presence of left artificial knee joint: Secondary | ICD-10-CM | POA: Diagnosis not present

## 2019-04-10 DIAGNOSIS — M25562 Pain in left knee: Secondary | ICD-10-CM | POA: Diagnosis not present

## 2019-04-10 DIAGNOSIS — M25561 Pain in right knee: Secondary | ICD-10-CM | POA: Diagnosis not present

## 2019-04-10 DIAGNOSIS — Z96651 Presence of right artificial knee joint: Secondary | ICD-10-CM | POA: Diagnosis not present

## 2019-05-07 DIAGNOSIS — E039 Hypothyroidism, unspecified: Secondary | ICD-10-CM | POA: Diagnosis not present

## 2019-05-07 DIAGNOSIS — E559 Vitamin D deficiency, unspecified: Secondary | ICD-10-CM | POA: Diagnosis not present

## 2019-05-07 DIAGNOSIS — E782 Mixed hyperlipidemia: Secondary | ICD-10-CM | POA: Diagnosis not present

## 2019-05-09 DIAGNOSIS — E782 Mixed hyperlipidemia: Secondary | ICD-10-CM | POA: Diagnosis not present

## 2019-05-09 DIAGNOSIS — Z1211 Encounter for screening for malignant neoplasm of colon: Secondary | ICD-10-CM | POA: Diagnosis not present

## 2019-05-09 DIAGNOSIS — Z0000 Encounter for general adult medical examination without abnormal findings: Secondary | ICD-10-CM | POA: Diagnosis not present

## 2019-05-09 DIAGNOSIS — Z8739 Personal history of other diseases of the musculoskeletal system and connective tissue: Secondary | ICD-10-CM | POA: Diagnosis not present

## 2019-05-09 DIAGNOSIS — Z125 Encounter for screening for malignant neoplasm of prostate: Secondary | ICD-10-CM | POA: Diagnosis not present

## 2019-05-09 DIAGNOSIS — Z6841 Body Mass Index (BMI) 40.0 and over, adult: Secondary | ICD-10-CM | POA: Diagnosis not present

## 2019-05-09 DIAGNOSIS — R609 Edema, unspecified: Secondary | ICD-10-CM | POA: Diagnosis not present

## 2019-05-09 DIAGNOSIS — I10 Essential (primary) hypertension: Secondary | ICD-10-CM | POA: Diagnosis not present

## 2019-06-27 DIAGNOSIS — Z23 Encounter for immunization: Secondary | ICD-10-CM | POA: Diagnosis not present

## 2019-10-26 DIAGNOSIS — L578 Other skin changes due to chronic exposure to nonionizing radiation: Secondary | ICD-10-CM | POA: Diagnosis not present

## 2019-10-26 DIAGNOSIS — L820 Inflamed seborrheic keratosis: Secondary | ICD-10-CM | POA: Diagnosis not present

## 2019-10-26 DIAGNOSIS — L918 Other hypertrophic disorders of the skin: Secondary | ICD-10-CM | POA: Diagnosis not present

## 2019-11-05 DIAGNOSIS — E039 Hypothyroidism, unspecified: Secondary | ICD-10-CM | POA: Diagnosis not present

## 2019-11-05 DIAGNOSIS — Z6841 Body Mass Index (BMI) 40.0 and over, adult: Secondary | ICD-10-CM | POA: Diagnosis not present

## 2019-11-05 DIAGNOSIS — I10 Essential (primary) hypertension: Secondary | ICD-10-CM | POA: Diagnosis not present

## 2019-11-05 DIAGNOSIS — E559 Vitamin D deficiency, unspecified: Secondary | ICD-10-CM | POA: Diagnosis not present

## 2019-11-05 DIAGNOSIS — M8949 Other hypertrophic osteoarthropathy, multiple sites: Secondary | ICD-10-CM | POA: Diagnosis not present

## 2019-11-05 DIAGNOSIS — G4733 Obstructive sleep apnea (adult) (pediatric): Secondary | ICD-10-CM | POA: Diagnosis not present

## 2019-11-05 DIAGNOSIS — Z1211 Encounter for screening for malignant neoplasm of colon: Secondary | ICD-10-CM | POA: Diagnosis not present

## 2019-11-05 DIAGNOSIS — Z87891 Personal history of nicotine dependence: Secondary | ICD-10-CM | POA: Diagnosis not present

## 2019-11-05 DIAGNOSIS — E782 Mixed hyperlipidemia: Secondary | ICD-10-CM | POA: Diagnosis not present

## 2019-11-05 DIAGNOSIS — E1165 Type 2 diabetes mellitus with hyperglycemia: Secondary | ICD-10-CM | POA: Diagnosis not present

## 2020-02-04 DIAGNOSIS — Z7689 Persons encountering health services in other specified circumstances: Secondary | ICD-10-CM | POA: Diagnosis not present

## 2020-02-04 DIAGNOSIS — R0602 Shortness of breath: Secondary | ICD-10-CM | POA: Diagnosis not present

## 2020-02-04 DIAGNOSIS — R809 Proteinuria, unspecified: Secondary | ICD-10-CM | POA: Diagnosis not present

## 2020-02-04 DIAGNOSIS — E1129 Type 2 diabetes mellitus with other diabetic kidney complication: Secondary | ICD-10-CM | POA: Diagnosis not present

## 2020-02-04 DIAGNOSIS — I10 Essential (primary) hypertension: Secondary | ICD-10-CM | POA: Diagnosis not present

## 2020-02-04 DIAGNOSIS — I872 Venous insufficiency (chronic) (peripheral): Secondary | ICD-10-CM | POA: Diagnosis not present

## 2020-02-04 DIAGNOSIS — E782 Mixed hyperlipidemia: Secondary | ICD-10-CM | POA: Diagnosis not present

## 2020-02-04 DIAGNOSIS — Z6841 Body Mass Index (BMI) 40.0 and over, adult: Secondary | ICD-10-CM | POA: Diagnosis not present

## 2020-02-04 DIAGNOSIS — R609 Edema, unspecified: Secondary | ICD-10-CM | POA: Diagnosis not present

## 2020-02-14 DIAGNOSIS — I517 Cardiomegaly: Secondary | ICD-10-CM | POA: Diagnosis not present

## 2020-07-01 DIAGNOSIS — M7989 Other specified soft tissue disorders: Secondary | ICD-10-CM | POA: Diagnosis not present

## 2020-07-01 DIAGNOSIS — E119 Type 2 diabetes mellitus without complications: Secondary | ICD-10-CM | POA: Diagnosis not present

## 2020-07-01 DIAGNOSIS — I83893 Varicose veins of bilateral lower extremities with other complications: Secondary | ICD-10-CM | POA: Diagnosis not present

## 2020-07-01 DIAGNOSIS — E669 Obesity, unspecified: Secondary | ICD-10-CM | POA: Diagnosis not present

## 2020-07-01 DIAGNOSIS — I890 Lymphedema, not elsewhere classified: Secondary | ICD-10-CM | POA: Diagnosis not present

## 2020-07-01 DIAGNOSIS — Z6841 Body Mass Index (BMI) 40.0 and over, adult: Secondary | ICD-10-CM | POA: Diagnosis not present

## 2020-07-01 DIAGNOSIS — I872 Venous insufficiency (chronic) (peripheral): Secondary | ICD-10-CM | POA: Diagnosis not present

## 2020-07-01 DIAGNOSIS — Z87891 Personal history of nicotine dependence: Secondary | ICD-10-CM | POA: Diagnosis not present

## 2020-07-01 DIAGNOSIS — I110 Hypertensive heart disease with heart failure: Secondary | ICD-10-CM | POA: Diagnosis not present

## 2020-08-06 DIAGNOSIS — M76891 Other specified enthesopathies of right lower limb, excluding foot: Secondary | ICD-10-CM | POA: Diagnosis not present

## 2020-08-06 DIAGNOSIS — R399 Unspecified symptoms and signs involving the genitourinary system: Secondary | ICD-10-CM | POA: Diagnosis not present

## 2020-08-27 DIAGNOSIS — I10 Essential (primary) hypertension: Secondary | ICD-10-CM | POA: Diagnosis not present

## 2020-08-27 DIAGNOSIS — G4733 Obstructive sleep apnea (adult) (pediatric): Secondary | ICD-10-CM | POA: Diagnosis not present

## 2020-08-27 DIAGNOSIS — I890 Lymphedema, not elsewhere classified: Secondary | ICD-10-CM | POA: Diagnosis not present

## 2020-08-27 DIAGNOSIS — E1165 Type 2 diabetes mellitus with hyperglycemia: Secondary | ICD-10-CM | POA: Diagnosis not present

## 2020-08-27 DIAGNOSIS — G4762 Sleep related leg cramps: Secondary | ICD-10-CM | POA: Diagnosis not present

## 2020-08-27 DIAGNOSIS — E039 Hypothyroidism, unspecified: Secondary | ICD-10-CM | POA: Diagnosis not present

## 2020-08-27 DIAGNOSIS — R350 Frequency of micturition: Secondary | ICD-10-CM | POA: Diagnosis not present

## 2020-08-27 DIAGNOSIS — M159 Polyosteoarthritis, unspecified: Secondary | ICD-10-CM | POA: Diagnosis not present

## 2020-08-27 DIAGNOSIS — Z125 Encounter for screening for malignant neoplasm of prostate: Secondary | ICD-10-CM | POA: Diagnosis not present

## 2020-08-27 DIAGNOSIS — Z1211 Encounter for screening for malignant neoplasm of colon: Secondary | ICD-10-CM | POA: Diagnosis not present

## 2020-08-27 DIAGNOSIS — Z0000 Encounter for general adult medical examination without abnormal findings: Secondary | ICD-10-CM | POA: Diagnosis not present

## 2020-08-27 DIAGNOSIS — E782 Mixed hyperlipidemia: Secondary | ICD-10-CM | POA: Diagnosis not present

## 2020-08-27 DIAGNOSIS — M76891 Other specified enthesopathies of right lower limb, excluding foot: Secondary | ICD-10-CM | POA: Diagnosis not present
